# Patient Record
Sex: Male | Born: 1955 | Race: White | Hispanic: No | Marital: Married | Smoking: Former smoker
Health system: Southern US, Community
[De-identification: ages and names within clinical notes are randomized; demographics above are authoritative.]

## PROBLEM LIST (undated history)

## (undated) DIAGNOSIS — E785 Hyperlipidemia, unspecified: Secondary | ICD-10-CM

## (undated) DIAGNOSIS — F32A Depression, unspecified: Secondary | ICD-10-CM

## (undated) DIAGNOSIS — N411 Chronic prostatitis: Secondary | ICD-10-CM

## (undated) DIAGNOSIS — Z720 Tobacco use: Secondary | ICD-10-CM

## (undated) DIAGNOSIS — F419 Anxiety disorder, unspecified: Secondary | ICD-10-CM

## (undated) DIAGNOSIS — I2510 Atherosclerotic heart disease of native coronary artery without angina pectoris: Secondary | ICD-10-CM

## (undated) DIAGNOSIS — G4730 Sleep apnea, unspecified: Secondary | ICD-10-CM

## (undated) DIAGNOSIS — I739 Peripheral vascular disease, unspecified: Secondary | ICD-10-CM

## (undated) DIAGNOSIS — I10 Essential (primary) hypertension: Secondary | ICD-10-CM

## (undated) DIAGNOSIS — K219 Gastro-esophageal reflux disease without esophagitis: Secondary | ICD-10-CM

## (undated) DIAGNOSIS — J449 Chronic obstructive pulmonary disease, unspecified: Secondary | ICD-10-CM

## (undated) DIAGNOSIS — G4733 Obstructive sleep apnea (adult) (pediatric): Secondary | ICD-10-CM

## (undated) DIAGNOSIS — E669 Obesity, unspecified: Secondary | ICD-10-CM

## (undated) DIAGNOSIS — F329 Major depressive disorder, single episode, unspecified: Secondary | ICD-10-CM

## (undated) DIAGNOSIS — Z86718 Personal history of other venous thrombosis and embolism: Secondary | ICD-10-CM

## (undated) HISTORY — PX: CYSTOSCOPY: SUR368

## (undated) HISTORY — DX: Obstructive sleep apnea (adult) (pediatric): G47.33

## (undated) HISTORY — DX: Peripheral vascular disease, unspecified: I73.9

## (undated) HISTORY — DX: Atherosclerotic heart disease of native coronary artery without angina pectoris: I25.10

## (undated) HISTORY — DX: Sleep apnea, unspecified: G47.30

## (undated) HISTORY — DX: Depression, unspecified: F32.A

## (undated) HISTORY — PX: CHOLECYSTECTOMY: SHX55

## (undated) HISTORY — DX: Essential (primary) hypertension: I10

## (undated) HISTORY — DX: Chronic prostatitis: N41.1

## (undated) HISTORY — DX: Personal history of other venous thrombosis and embolism: Z86.718

## (undated) HISTORY — DX: Major depressive disorder, single episode, unspecified: F32.9

## (undated) HISTORY — DX: Gastro-esophageal reflux disease without esophagitis: K21.9

## (undated) HISTORY — PX: KNEE ARTHROSCOPY: SUR90

## (undated) HISTORY — DX: Anxiety disorder, unspecified: F41.9

## (undated) HISTORY — DX: Chronic obstructive pulmonary disease, unspecified: J44.9

## (undated) HISTORY — DX: Tobacco use: Z72.0

## (undated) HISTORY — DX: Hyperlipidemia, unspecified: E78.5

---

## 1983-09-26 ENCOUNTER — Encounter: Payer: Self-pay | Admitting: Family Medicine

## 1983-09-26 LAB — CONVERTED CEMR LAB
Blood Glucose, Fasting: 91 mg/dL
RBC count: 5.36 10*6/uL

## 1987-11-24 ENCOUNTER — Encounter: Payer: Self-pay | Admitting: Family Medicine

## 1987-11-24 LAB — CONVERTED CEMR LAB: WBC, blood: 7.1 10*3/uL

## 1989-02-21 ENCOUNTER — Encounter: Payer: Self-pay | Admitting: Family Medicine

## 1989-02-21 LAB — CONVERTED CEMR LAB: RBC count: 5.5 10*6/uL

## 1991-05-03 ENCOUNTER — Encounter: Payer: Self-pay | Admitting: Family Medicine

## 1991-05-03 LAB — CONVERTED CEMR LAB
Blood Glucose, Fasting: 97 mg/dL
RBC count: 5.13 10*6/uL
WBC, blood: 9.8 10*3/uL

## 1992-08-03 ENCOUNTER — Encounter: Payer: Self-pay | Admitting: Family Medicine

## 1992-08-03 LAB — CONVERTED CEMR LAB
RBC count: 5.61 10*6/uL
WBC, blood: 8.8 10*3/uL

## 1993-08-30 ENCOUNTER — Encounter: Payer: Self-pay | Admitting: Family Medicine

## 1993-08-30 LAB — CONVERTED CEMR LAB: PSA: 0.8 ng/mL

## 1993-08-31 ENCOUNTER — Encounter: Payer: Self-pay | Admitting: Family Medicine

## 1994-03-26 ENCOUNTER — Encounter: Payer: Self-pay | Admitting: Family Medicine

## 1994-03-26 LAB — CONVERTED CEMR LAB
Blood Glucose, Fasting: 80 mg/dL
RBC count: 5.37 10*6/uL

## 1994-07-30 ENCOUNTER — Encounter: Payer: Self-pay | Admitting: Family Medicine

## 1994-07-30 LAB — CONVERTED CEMR LAB
Blood Glucose, Fasting: 96 mg/dL
WBC, blood: 8 10*3/uL

## 1995-09-07 ENCOUNTER — Encounter: Payer: Self-pay | Admitting: Family Medicine

## 1995-09-07 LAB — CONVERTED CEMR LAB: PSA: 0.4 ng/mL

## 1995-09-23 ENCOUNTER — Encounter: Payer: Self-pay | Admitting: Family Medicine

## 1996-01-21 ENCOUNTER — Encounter: Payer: Self-pay | Admitting: Family Medicine

## 1996-01-21 LAB — CONVERTED CEMR LAB: PSA: 0.3 ng/mL

## 1996-01-22 ENCOUNTER — Encounter: Payer: Self-pay | Admitting: Family Medicine

## 1996-01-22 LAB — CONVERTED CEMR LAB
Blood Glucose, Fasting: 101 mg/dL
RBC count: 5.26 10*6/uL

## 1996-04-15 ENCOUNTER — Encounter: Payer: Self-pay | Admitting: Family Medicine

## 1996-04-15 LAB — CONVERTED CEMR LAB: RBC count: 5.2 10*6/uL

## 1996-10-17 ENCOUNTER — Encounter: Payer: Self-pay | Admitting: Family Medicine

## 1996-10-17 LAB — CONVERTED CEMR LAB: RBC count: 5.33 10*6/uL

## 1996-10-19 ENCOUNTER — Encounter: Payer: Self-pay | Admitting: Family Medicine

## 1996-10-19 LAB — CONVERTED CEMR LAB
Blood Glucose, Fasting: 89 mg/dL
PSA: 0.4 ng/mL

## 1997-05-17 ENCOUNTER — Encounter: Payer: Self-pay | Admitting: Family Medicine

## 1997-05-17 LAB — CONVERTED CEMR LAB: Blood Glucose, Fasting: 85 mg/dL

## 1997-09-26 ENCOUNTER — Encounter: Payer: Self-pay | Admitting: Family Medicine

## 1997-09-26 LAB — CONVERTED CEMR LAB: RBC count: 5.11 10*6/uL

## 1997-09-27 ENCOUNTER — Encounter: Payer: Self-pay | Admitting: Family Medicine

## 1997-09-27 LAB — CONVERTED CEMR LAB: Blood Glucose, Fasting: 83 mg/dL

## 1997-12-27 ENCOUNTER — Encounter: Payer: Self-pay | Admitting: Family Medicine

## 1997-12-27 LAB — CONVERTED CEMR LAB: WBC, blood: 8.4 10*3/uL

## 1999-06-18 ENCOUNTER — Encounter: Payer: Self-pay | Admitting: Family Medicine

## 2001-01-04 ENCOUNTER — Encounter: Payer: Self-pay | Admitting: Urology

## 2001-01-07 ENCOUNTER — Ambulatory Visit (HOSPITAL_COMMUNITY): Admission: RE | Admit: 2001-01-07 | Discharge: 2001-01-07 | Payer: Self-pay | Admitting: Urology

## 2001-01-07 ENCOUNTER — Encounter (INDEPENDENT_AMBULATORY_CARE_PROVIDER_SITE_OTHER): Payer: Self-pay | Admitting: Specialist

## 2003-09-25 ENCOUNTER — Emergency Department (HOSPITAL_COMMUNITY): Admission: EM | Admit: 2003-09-25 | Discharge: 2003-09-25 | Payer: Self-pay | Admitting: Emergency Medicine

## 2004-06-26 ENCOUNTER — Ambulatory Visit: Payer: Self-pay | Admitting: Family Medicine

## 2004-12-16 ENCOUNTER — Ambulatory Visit: Payer: Self-pay | Admitting: Family Medicine

## 2004-12-16 LAB — CONVERTED CEMR LAB
Blood Glucose, Fasting: 105 mg/dL
PSA: 0.6 ng/mL
TSH: 1.4 microintl units/mL

## 2004-12-18 ENCOUNTER — Ambulatory Visit: Payer: Self-pay | Admitting: Family Medicine

## 2005-01-30 ENCOUNTER — Ambulatory Visit: Payer: Self-pay | Admitting: Family Medicine

## 2005-07-25 ENCOUNTER — Ambulatory Visit: Payer: Self-pay | Admitting: Family Medicine

## 2005-12-03 ENCOUNTER — Ambulatory Visit: Payer: Self-pay | Admitting: Family Medicine

## 2006-02-04 ENCOUNTER — Ambulatory Visit: Payer: Self-pay | Admitting: Family Medicine

## 2006-02-04 LAB — CONVERTED CEMR LAB
Blood Glucose, Fasting: 100 mg/dL
Hgb A1c MFr Bld: 5.2 %
PSA: 0.48 ng/mL
RBC count: 5.24 10*6/uL
TSH: 1.18 microintl units/mL
WBC, blood: 7.2 10*3/uL

## 2006-03-13 ENCOUNTER — Ambulatory Visit: Payer: Self-pay | Admitting: Family Medicine

## 2006-05-26 ENCOUNTER — Ambulatory Visit: Payer: Self-pay | Admitting: Family Medicine

## 2006-05-26 LAB — CONVERTED CEMR LAB: Rapid Strep: NEGATIVE

## 2006-06-10 ENCOUNTER — Ambulatory Visit: Payer: Self-pay | Admitting: Family Medicine

## 2006-08-31 ENCOUNTER — Ambulatory Visit: Payer: Self-pay | Admitting: Family Medicine

## 2006-12-03 ENCOUNTER — Ambulatory Visit: Payer: Self-pay | Admitting: Family Medicine

## 2006-12-03 DIAGNOSIS — F419 Anxiety disorder, unspecified: Secondary | ICD-10-CM | POA: Insufficient documentation

## 2007-01-12 ENCOUNTER — Ambulatory Visit: Payer: Self-pay | Admitting: Family Medicine

## 2007-01-12 DIAGNOSIS — Z87891 Personal history of nicotine dependence: Secondary | ICD-10-CM | POA: Insufficient documentation

## 2007-01-12 DIAGNOSIS — R0602 Shortness of breath: Secondary | ICD-10-CM | POA: Insufficient documentation

## 2007-01-22 ENCOUNTER — Ambulatory Visit: Payer: Self-pay | Admitting: Internal Medicine

## 2007-01-28 ENCOUNTER — Ambulatory Visit: Payer: Self-pay | Admitting: Pulmonary Disease

## 2007-02-03 ENCOUNTER — Encounter: Payer: Self-pay | Admitting: Family Medicine

## 2007-02-03 ENCOUNTER — Ambulatory Visit: Payer: Self-pay

## 2007-02-03 ENCOUNTER — Encounter: Payer: Self-pay | Admitting: Internal Medicine

## 2007-03-05 ENCOUNTER — Ambulatory Visit: Payer: Self-pay | Admitting: Pulmonary Disease

## 2007-03-05 ENCOUNTER — Ambulatory Visit (HOSPITAL_BASED_OUTPATIENT_CLINIC_OR_DEPARTMENT_OTHER): Admission: RE | Admit: 2007-03-05 | Discharge: 2007-03-05 | Payer: Self-pay | Admitting: Pulmonary Disease

## 2007-04-28 ENCOUNTER — Ambulatory Visit: Payer: Self-pay | Admitting: Pulmonary Disease

## 2007-04-28 DIAGNOSIS — J449 Chronic obstructive pulmonary disease, unspecified: Secondary | ICD-10-CM | POA: Insufficient documentation

## 2007-04-28 DIAGNOSIS — G4733 Obstructive sleep apnea (adult) (pediatric): Secondary | ICD-10-CM | POA: Insufficient documentation

## 2007-04-28 DIAGNOSIS — J4489 Other specified chronic obstructive pulmonary disease: Secondary | ICD-10-CM | POA: Insufficient documentation

## 2007-07-22 ENCOUNTER — Telehealth (INDEPENDENT_AMBULATORY_CARE_PROVIDER_SITE_OTHER): Payer: Self-pay | Admitting: Internal Medicine

## 2007-07-29 ENCOUNTER — Ambulatory Visit: Payer: Self-pay | Admitting: Family Medicine

## 2007-07-29 DIAGNOSIS — R079 Chest pain, unspecified: Secondary | ICD-10-CM | POA: Insufficient documentation

## 2007-07-29 DIAGNOSIS — R252 Cramp and spasm: Secondary | ICD-10-CM | POA: Insufficient documentation

## 2007-12-06 ENCOUNTER — Ambulatory Visit: Payer: Self-pay | Admitting: Family Medicine

## 2007-12-06 LAB — CONVERTED CEMR LAB
ALT: 24 units/L (ref 0–53)
AST: 18 units/L (ref 0–37)
Albumin: 3.7 g/dL (ref 3.5–5.2)
Alkaline Phosphatase: 65 units/L (ref 39–117)
BUN: 12 mg/dL (ref 6–23)
Basophils Absolute: 0.1 10*3/uL (ref 0.0–0.1)
Basophils Relative: 0.8 % (ref 0.0–3.0)
Bilirubin, Direct: 0.1 mg/dL (ref 0.0–0.3)
CO2: 29 meq/L (ref 19–32)
Calcium: 9.2 mg/dL (ref 8.4–10.5)
Chloride: 108 meq/L (ref 96–112)
Cholesterol: 166 mg/dL (ref 0–200)
Creatinine, Ser: 1.2 mg/dL (ref 0.4–1.5)
Direct LDL: 94.1 mg/dL
Eosinophils Absolute: 0.3 10*3/uL (ref 0.0–0.7)
Eosinophils Relative: 3.4 % (ref 0.0–5.0)
GFR calc Af Amer: 83 mL/min
GFR calc non Af Amer: 68 mL/min
Glucose, Bld: 108 mg/dL — ABNORMAL HIGH (ref 70–99)
HCT: 46.8 % (ref 39.0–52.0)
HDL: 23.6 mg/dL — ABNORMAL LOW (ref >39.0)
Hemoglobin: 16.3 g/dL (ref 13.0–17.0)
Iron: 75 ug/dL (ref 42–165)
Lymphocytes Relative: 29.1 % (ref 12.0–46.0)
MCHC: 34.9 g/dL (ref 30.0–36.0)
MCV: 89.9 fL (ref 78.0–100.0)
Monocytes Absolute: 0.5 10*3/uL (ref 0.1–1.0)
Monocytes Relative: 6.2 % (ref 3.0–12.0)
Neutro Abs: 5.3 10*3/uL (ref 1.4–7.7)
Neutrophils Relative %: 60.5 % (ref 43.0–77.0)
PSA: 0.74 ng/mL (ref 0.10–4.00)
Platelets: 224 10*3/uL (ref 150–400)
Potassium: 5.1 meq/L (ref 3.5–5.1)
RBC: 5.21 M/uL (ref 4.22–5.81)
RDW: 12.5 % (ref 11.5–14.6)
Sodium: 143 meq/L (ref 135–145)
TSH: 1.55 microintl units/mL (ref 0.35–5.50)
Total Bilirubin: 0.6 mg/dL (ref 0.3–1.2)
Total CHOL/HDL Ratio: 7
Total Protein: 6.8 g/dL (ref 6.0–8.3)
Triglycerides: 266 mg/dL (ref 0–149)
VLDL: 53 mg/dL — ABNORMAL HIGH (ref 0–40)
WBC: 8.7 10*3/uL (ref 4.5–10.5)

## 2007-12-09 ENCOUNTER — Ambulatory Visit: Payer: Self-pay | Admitting: Family Medicine

## 2007-12-09 DIAGNOSIS — R7309 Other abnormal glucose: Secondary | ICD-10-CM | POA: Insufficient documentation

## 2007-12-09 DIAGNOSIS — K219 Gastro-esophageal reflux disease without esophagitis: Secondary | ICD-10-CM | POA: Insufficient documentation

## 2007-12-13 ENCOUNTER — Encounter: Payer: Self-pay | Admitting: Family Medicine

## 2007-12-13 DIAGNOSIS — E785 Hyperlipidemia, unspecified: Secondary | ICD-10-CM | POA: Insufficient documentation

## 2007-12-13 DIAGNOSIS — I10 Essential (primary) hypertension: Secondary | ICD-10-CM | POA: Insufficient documentation

## 2007-12-13 DIAGNOSIS — N3010 Interstitial cystitis (chronic) without hematuria: Secondary | ICD-10-CM | POA: Insufficient documentation

## 2007-12-13 DIAGNOSIS — Z9889 Other specified postprocedural states: Secondary | ICD-10-CM | POA: Insufficient documentation

## 2007-12-26 DIAGNOSIS — F3289 Other specified depressive episodes: Secondary | ICD-10-CM | POA: Insufficient documentation

## 2007-12-26 DIAGNOSIS — F329 Major depressive disorder, single episode, unspecified: Secondary | ICD-10-CM | POA: Insufficient documentation

## 2007-12-26 DIAGNOSIS — N401 Enlarged prostate with lower urinary tract symptoms: Secondary | ICD-10-CM | POA: Insufficient documentation

## 2007-12-26 DIAGNOSIS — N419 Inflammatory disease of prostate, unspecified: Secondary | ICD-10-CM | POA: Insufficient documentation

## 2008-04-12 ENCOUNTER — Ambulatory Visit: Payer: Self-pay | Admitting: Family Medicine

## 2008-04-12 DIAGNOSIS — G8929 Other chronic pain: Secondary | ICD-10-CM | POA: Insufficient documentation

## 2008-04-12 DIAGNOSIS — M545 Low back pain: Secondary | ICD-10-CM | POA: Insufficient documentation

## 2008-06-22 ENCOUNTER — Ambulatory Visit: Payer: Self-pay | Admitting: Family Medicine

## 2008-06-23 ENCOUNTER — Ambulatory Visit: Payer: Self-pay | Admitting: Internal Medicine

## 2008-06-30 ENCOUNTER — Ambulatory Visit: Payer: Self-pay | Admitting: Internal Medicine

## 2008-06-30 ENCOUNTER — Inpatient Hospital Stay (HOSPITAL_BASED_OUTPATIENT_CLINIC_OR_DEPARTMENT_OTHER): Admission: RE | Admit: 2008-06-30 | Discharge: 2008-06-30 | Payer: Self-pay | Admitting: Internal Medicine

## 2008-07-13 ENCOUNTER — Ambulatory Visit: Payer: Self-pay | Admitting: Family Medicine

## 2008-07-18 ENCOUNTER — Ambulatory Visit: Payer: Self-pay

## 2008-07-25 ENCOUNTER — Encounter (INDEPENDENT_AMBULATORY_CARE_PROVIDER_SITE_OTHER): Payer: Self-pay | Admitting: *Deleted

## 2008-07-25 DIAGNOSIS — I2510 Atherosclerotic heart disease of native coronary artery without angina pectoris: Secondary | ICD-10-CM | POA: Insufficient documentation

## 2008-07-25 DIAGNOSIS — I10 Essential (primary) hypertension: Secondary | ICD-10-CM | POA: Insufficient documentation

## 2008-07-26 ENCOUNTER — Encounter: Payer: Self-pay | Admitting: Internal Medicine

## 2008-07-26 ENCOUNTER — Ambulatory Visit: Payer: Self-pay | Admitting: Internal Medicine

## 2008-07-26 DIAGNOSIS — I7389 Other specified peripheral vascular diseases: Secondary | ICD-10-CM | POA: Insufficient documentation

## 2008-07-26 DIAGNOSIS — R0989 Other specified symptoms and signs involving the circulatory and respiratory systems: Secondary | ICD-10-CM | POA: Insufficient documentation

## 2008-11-01 ENCOUNTER — Telehealth: Payer: Self-pay | Admitting: Family Medicine

## 2008-11-02 ENCOUNTER — Ambulatory Visit: Payer: Self-pay | Admitting: Family Medicine

## 2008-12-12 ENCOUNTER — Ambulatory Visit: Payer: Self-pay | Admitting: Family Medicine

## 2008-12-12 LAB — CONVERTED CEMR LAB
ALT: 22 units/L (ref 0–53)
BUN: 12 mg/dL (ref 6–23)
Basophils Relative: 0.4 % (ref 0.0–3.0)
CO2: 29 meq/L (ref 19–32)
Chloride: 109 meq/L (ref 96–112)
Cholesterol: 187 mg/dL (ref 0–200)
Creatinine, Ser: 1 mg/dL (ref 0.4–1.5)
Creatinine,U: 152.9 mg/dL
Eosinophils Absolute: 0.2 10*3/uL (ref 0.0–0.7)
Eosinophils Relative: 2.7 % (ref 0.0–5.0)
HCT: 47.1 % (ref 39.0–52.0)
Lymphs Abs: 2.3 10*3/uL (ref 0.7–4.0)
MCHC: 34.7 g/dL (ref 30.0–36.0)
MCV: 90.4 fL (ref 78.0–100.0)
Microalb, Ur: 3.1 mg/dL — ABNORMAL HIGH (ref 0.0–1.9)
Monocytes Absolute: 0.6 10*3/uL (ref 0.1–1.0)
Neutrophils Relative %: 59.3 % (ref 43.0–77.0)
Platelets: 185 10*3/uL (ref 150.0–400.0)
Potassium: 5.3 meq/L — ABNORMAL HIGH (ref 3.5–5.1)
TSH: 1.58 microintl units/mL (ref 0.35–5.50)
Total Bilirubin: 0.8 mg/dL (ref 0.3–1.2)
Total Protein: 7 g/dL (ref 6.0–8.3)
VLDL: 41.4 mg/dL — ABNORMAL HIGH (ref 0.0–40.0)
WBC: 7.7 10*3/uL (ref 4.5–10.5)

## 2008-12-13 ENCOUNTER — Encounter: Payer: Self-pay | Admitting: Family Medicine

## 2008-12-18 ENCOUNTER — Ambulatory Visit: Payer: Self-pay | Admitting: Family Medicine

## 2008-12-19 ENCOUNTER — Encounter: Payer: Self-pay | Admitting: Cardiovascular Disease

## 2008-12-19 ENCOUNTER — Ambulatory Visit: Payer: Self-pay

## 2009-01-19 ENCOUNTER — Encounter: Payer: Self-pay | Admitting: Family Medicine

## 2009-03-07 ENCOUNTER — Encounter: Payer: Self-pay | Admitting: Family Medicine

## 2009-07-24 ENCOUNTER — Telehealth: Payer: Self-pay | Admitting: Family Medicine

## 2009-08-22 ENCOUNTER — Ambulatory Visit: Payer: Self-pay | Admitting: Family Medicine

## 2009-09-18 ENCOUNTER — Ambulatory Visit: Payer: Self-pay | Admitting: Family Medicine

## 2009-09-18 DIAGNOSIS — J069 Acute upper respiratory infection, unspecified: Secondary | ICD-10-CM | POA: Insufficient documentation

## 2009-09-25 ENCOUNTER — Encounter: Payer: Self-pay | Admitting: Family Medicine

## 2009-12-18 ENCOUNTER — Encounter (INDEPENDENT_AMBULATORY_CARE_PROVIDER_SITE_OTHER): Payer: Self-pay | Admitting: *Deleted

## 2009-12-26 ENCOUNTER — Encounter: Payer: Self-pay | Admitting: Family Medicine

## 2010-02-05 ENCOUNTER — Encounter: Payer: Self-pay | Admitting: Family Medicine

## 2010-02-20 ENCOUNTER — Encounter: Payer: Self-pay | Admitting: Family Medicine

## 2010-02-27 ENCOUNTER — Ambulatory Visit (HOSPITAL_COMMUNITY): Admission: RE | Admit: 2010-02-27 | Discharge: 2010-02-27 | Payer: Self-pay | Admitting: Urology

## 2010-03-19 ENCOUNTER — Encounter: Payer: Self-pay | Admitting: Family Medicine

## 2010-03-28 ENCOUNTER — Telehealth: Payer: Self-pay | Admitting: Family Medicine

## 2010-06-09 LAB — CONVERTED CEMR LAB
BUN: 11 mg/dL (ref 6–23)
CO2: 24 meq/L (ref 19–32)
Calcium: 9.3 mg/dL (ref 8.4–10.5)
Creatinine, Ser: 0.98 mg/dL (ref 0.40–1.50)
Glucose, Bld: 93 mg/dL (ref 70–99)
HCT: 49.8 % (ref 39.0–52.0)
Hemoglobin: 16.9 g/dL (ref 13.0–17.0)
MCHC: 33.9 g/dL (ref 30.0–36.0)
MCV: 87.7 fL (ref 78.0–100.0)
Platelets: 254 10*3/uL (ref 150–400)
RDW: 12.7 % (ref 11.5–15.5)

## 2010-06-11 NOTE — Letter (Signed)
Summary: [facility]  [facility]   Imported By: Lanelle Bal 02/28/2010 12:54:47  _____________________________________________________________________  External Attachment:    Type:   Image     Comment:   External Document

## 2010-06-11 NOTE — Medication Information (Signed)
Summary: Order for CPAP Supplies/[facility]  Order for CPAP Supplies/[facility]   Imported By: Maryln Gottron 12/28/2009 15:45:01  _____________________________________________________________________  External Attachment:    Type:   Image     Comment:   External Document

## 2010-06-11 NOTE — Letter (Signed)
Summary: Dr.Marc Magod,GI,Note  Dr.Marc Magod,GI,Note   Imported By: Beau Fanny 09/25/2009 16:40:14  _____________________________________________________________________  External Attachment:    Type:   Image     Comment:   External Document

## 2010-06-11 NOTE — Assessment & Plan Note (Signed)
Summary: MED REFILL/DLO   Vital Signs:  Patient profile:   55 year old male Weight:      271.25 pounds BMI:     37.97 Temp:     98.6 degrees F oral Pulse rate:   64 / minute Pulse rhythm:   regular BP sitting:   138 / 78  (left arm) Cuff size:   large  Vitals Entered By: Sydell Axon LPN (August 22, 2009 12:25 PM) CC: Discuss changing directions on medications and needs refills   History of Present Illness: Pt here for medication refill and needs refills. He wants to switch back to monthly prescriptions. He has had trouble with three month and cutting pills and doing things that do not benefit him and make life harder He thinks his wife is ready to quit smoking which he thinks will make it possible for him to quit as well. He knows he must then tackle weight and is willing to do so.  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) 9)  Depression, Recurrent  (ICD-311) 10)  Hyperlipidemia  (ICD-272.4) 11)  Hypertension, Essential  (ICD-401.9) 12)  Carpal Tunnel Release, Bilateral, Hx of  (ICD-V45.89) 13)  Chronic Interstitial Cystitis  (ICD-595.1) 14)  Hyperglycemia  (ICD-790.29) 15)  Gerd  (ICD-530.81) 16)  Special Screening Malignant Neoplasm of Prostate  (ICD-V76.44) 17)  Health Maintenance Exam  (ICD-V70.0) 18)  Muscle Cramps  (ICD-729.82) 19)  Obesity  (ICD-278.00) 20)  Chest Pain Unspecified  (ICD-786.50) 21)  Copd, Mild  (ICD-496) 22)  Obstructive Sleep Apnea  (ICD-327.23) 23)  Hx, Personal, Tobacco Use  (ICD-V15.82) 24)  Sob  (ICD-786.05) 25)  Anxiety  (ICD-300.00)  Medications Prior to Update: 1)  Terazosin Hcl 10 Mg Caps (Terazosin Hcl) ....  Take 1 Tablet By Mouth Once A Day 2)  Lisinopril 5 Mg Tabs (Lisinopril) .... Take 1 Tablet By Mouth Once A Day 3)  Alprazolam 1 Mg Tabs (Alprazolam) .... Take 1/2 Tablet By Mouth Once Daily 4)  Adult Aspirin Ec Low Strength 81 Mg  Tbec (Aspirin) .... Take 1 Tablet By Mouth Once A Day 5)  Sertraline Hcl 100 Mg  Tabs (Sertraline Hcl) .... 1/2 Tablet Daily By Mouth  Allergies: 1)  * Relafen (Nabumetone)  Physical Exam  General:  Well-developed,well-nourished,in no acute distress; alert,appropriate and cooperative throughout examination, comfortable, breathing easily but obese. Head:  Normocephalic and atraumatic without obvious abnormalities. No apparent alopecia or balding. Eyes:  Conjunctiva clear bilaterally.  Ears:  External ear exam shows no significant lesions or deformities.  Otoscopic examination reveals tympanic membranes are intact bilaterally without bulging, retraction, inflammation or discharge. Hearing is grossly normal bilaterally. Cerumen impacted bilat. Nose:  No deformity or lesions noted.   Mouth:  Lips, gums, and teeth appear normal. Tongue is intact without lesions.  Neck:  No deformities, masses, or tenderness noted. Chest Wall:  No deformities, masses, tenderness or gynecomastia noted. Lungs:  Normal respiratory effort, chest expands symmetrically. Lungs are clear to auscultation, no crackles or wheezes. Heart:  Normal rate and regular rhythm. S1 and S2 normal without gallop, murmur, click, rub or other extra sounds.   Impression & Recommendations:  Problem # 1:  HYPERTENSION, BENIGN (ICD-401.1) Assessment Unchanged Stable and weight loss should bring it down. Cont curr meds. His updated medication list for this problem includes:    Terazosin Hcl 10 Mg Caps (Terazosin hcl) .Marland Kitchen... Take 1 tablet by mouth once a day    Lisinopril 5 Mg Tabs (Lisinopril) .Marland Kitchen... Take 1 tablet by mouth once a day  BP today: 138/78 Prior BP: 130/74 (12/18/2008)  Labs Reviewed: K+: 5.3  (12/12/2008) Creat: : 1.0 (12/12/2008)   Chol: 187 (12/12/2008)   HDL: 31.20 (12/12/2008)   LDL: DEL (12/06/2007)   TG: 207.0 (12/12/2008)  Problem # 2:  ANXIETY (ICD-300.00) Assessment: Unchanged  Stable, incr sertraline (from 1/2 to whole tab) and see if can limit Xanax. His updated medication list for this problem includes:    Alprazolam 1 Mg Tabs (Alprazolam) .Marland Kitchen... Take 1/2 tablet by mouth twice daily as needed for anxiety.    Sertraline Hcl 100 Mg Tabs (Sertraline hcl) .Marland Kitchen... 1 tablet daily by mouth  Discussed medication use and relaxation techniques.   Problem # 3:  DEPRESSION, RECURRENT (ICD-311) Assessment: Unchanged Well controlled. Will need to cont Sertraline due to recurrence in my opinion. His updated medication list for this problem includes:    Alprazolam 1 Mg Tabs (Alprazolam) .Marland Kitchen... Take 1/2 tablet by mouth twice daily as needed for anxiety.    Sertraline Hcl 100 Mg Tabs (Sertraline hcl) .Marland Kitchen... 1 tablet daily by mouth  Complete Medication List: 1)  Terazosin Hcl 10 Mg Caps (Terazosin hcl) .... Take 1 tablet by mouth once a day 2)  Lisinopril 5 Mg Tabs (Lisinopril) .... Take 1 tablet by mouth once a day 3)  Alprazolam 1 Mg Tabs (Alprazolam) .... Take 1/2 tablet by mouth twice daily as needed for anxiety. 4)  Adult Aspirin Ec Low Strength 81 Mg Tbec (Aspirin) .... Take 1 tablet by mouth once a day 5)  Sertraline Hcl 100 Mg Tabs (Sertraline hcl) .Marland Kitchen.. 1 tablet daily by mouth 6)  Nexium 40 Mg Cpdr (Esomeprazole magnesium) .... Take one by mouth daily  Patient Instructions: 1)  RTC as discussed. Prescriptions: SERTRALINE HCL 100 MG  TABS (SERTRALINE HCL) 1 tablet daily by mouth  #30 x 12   Entered and Authorized by:   Shaune Leeks MD   Signed by:   Shaune Leeks MD on 08/22/2009   Method used:   [facility] to        [facility] [phone]* (retail)       [address]       [place], [place]  [address]       Ph: [phone]       Fax:  [phone]   RxID:   [id number] ALPRAZOLAM 1 MG TABS (ALPRAZOLAM) Take 1/2 tablet by mouth twice daily as needed for anxiety.  #30 x 12   Entered and Authorized by:   Shaune Leeks MD   Signed by:   Shaune Leeks MD on 08/22/2009   Method used:   Print then Give to Patient   RxID:   [id number] LISINOPRIL 5 MG TABS (LISINOPRIL) Take 1 tablet by mouth once a day  #30 x 12   Entered and Authorized by:   Shaune Leeks MD   Signed by:   Shaune Leeks MD on 08/22/2009   Method used:   [facility]        [facility]  [address] [phone]* (retail)  [address]       [place], [place]  [address]       Ph: [phone]       Fax: [phone]   RxID:   [id number] TERAZOSIN HCL 10 MG CAPS (TERAZOSIN HCL) Take 1 tablet by mouth once a day  #30 x 12   Entered and Authorized by:   Shaune Leeks MD   Signed by:   Shaune Leeks MD on 08/22/2009   Method used:   Electronically to        [facility] [phone]* (retail)       [address]       [place], [place]  [address]       Ph: [phone]       Fax: [phone]   RxID:   [id number]   Current Allergies (reviewed today): * RELAFEN (NABUMETONE)

## 2010-06-11 NOTE — Letter (Signed)
Summary: Nadara Eaton letter  [place] at  [facility]  [address] [place], [place] [address]   Phone: [phone]  Fax: [phone]       12/18/2009 MRN: [id number]  Shmuel Matsen [address] [place], [place]  [address]  Dear Mr. Luca,  [facility] announce the retirement of Arta Silence, M.D., from full-time practice at the [facility] office effective November 08, 2009 and his plans of returning part-time.  It is important to Dr. Hetty Ely and to our practice that you understand that [facility] has seven physicians in our office for your health care needs.  We will continue to offer the same exceptional care that you have today.    Dr. Hetty Ely has spoken to many of you about his plans for retirement and returning part-time in the fall.   We will continue to work with you through the transition to schedule appointments for you in the office and meet the high standards that [facility] is committed to.   Again, it is with great pleasure that we share the news that Dr. Hetty Ely will return to [facility] in October of 2011 with a reduced schedule.    If you have any questions, or would like to request an appointment with one of our physicians, please call us at [phone] and press the option for Scheduling an appointment.  We take pleasure in providing you with excellent patient care and look forward to seeing you at your next office visit.  Our [facility] Physicians are:  Tillman Abide, M.D. Laurita Quint, M.D. Roxy Manns, M.D. Kerby Nora, M.D. Hannah Beat, M.D. Ruthe Mannan, M.D. We proudly welcomed Raechel Ache, M.D. and Eustaquio Boyden, M.D. to the practice in July/August 2011.  Sincerely,  [facility]

## 2010-06-11 NOTE — Assessment & Plan Note (Signed)
Summary: TICK BITE, FLU LIKE SYMTH AND BACK PAIN   Vital Signs:  Patient profile:   54 year old male Weight:      268.25 pounds Temp:     98.6 degrees F oral Pulse rate:   60 / minute Pulse rhythm:   regular BP sitting:   120 / 70  (left arm) Cuff size:   large  Vitals Entered By: Sydell Axon LPN (Sep 18, 2009 11:31 AM) CC: Has had 3 tick bites in the last 3 weeks, having flu like symptoms, backache, head congestion and ear pain   History of Present Illness: Pt here or tick bites x3 each he thinks were on him less than 24hrs. The first was under his arm and could have been longer. He was in [place] last week working and felt bad last week. Last night he all of a sudden felt like he was injected with the flu. His ears are better than last time. He also hurt his back over the weekend. He took a vicodin this AM. He moved 14 yrds of bark over the weekend and that caused his pain.  He has headache in the frontal area but also all over. He is alsmost woozy from pressure esp with cough, some bad ear pain last night, not as bad today. He had fever and chills yesterday and worse last night, not as bad today, some ST last night, better today, Throat also feels swollen, rhinitis that is clear, cough that is better today also, not able to get anything up but hurt his back.  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82) 19)  Obesity  (ICD-278.00) 20)  Chest Pain Unspecified  (ICD-786.50) 21)  Copd, Mild  (ICD-496) 22)  Obstructive Sleep Apnea  (ICD-327.23) 23)  Hx, Personal, Tobacco Use  (ICD-V15.82) 24)  Sob  (ICD-786.05) 25)  Anxiety  (ICD-300.00)  Medications Prior to Update: 1)  Terazosin Hcl 10 Mg Caps (Terazosin Hcl) .... Take 1 Tablet By Mouth Once A Day 2)  Lisinopril 5 Mg Tabs (Lisinopril) .... Take 1 Tablet By Mouth Once A Day 3)  Alprazolam 1 Mg Tabs (Alprazolam) .... Take 1/2 Tablet By Mouth Twice Daily As Needed For Anxiety. 4)  Adult Aspirin Ec Low Strength 81 Mg  Tbec (Aspirin) .... Take 1 Tablet By Mouth Once A Day 5)  Sertraline Hcl 100 Mg  Tabs (Sertraline Hcl) .Marland Kitchen.. 1 Tablet Daily By Mouth 6)  Nexium 40 Mg Cpdr (Esomeprazole Magnesium) .... Take One By Mouth Daily  Allergies: 1)  * Relafen (Nabumetone)  Physical Exam  General:  Well-developed,well-nourished,in no acute distress; alert,appropriate and cooperative throughout examination, comfortable, breathing easily but obese. Head:  Normocephalic and atraumatic without obvious abnormalities. No apparent alopecia or balding. Eyes:  Conjunctiva clear bilaterally.  Ears:  External ear exam shows no significant lesions or deformities.  Otoscopic examination reveals tympanic membranes are intact bilaterally without bulging, retraction, inflammation or discharge. Hearing is grossly normal bilaterally. Cerumen impacted bilat. Nose:  No deformity or lesions noted.   Mouth:  Lips, gums, and teeth appear normal. Tongue is intact without lesions.  Neck:  No deformities, masses, or tenderness noted. Chest Wall:  No deformities, masses, tenderness or gynecomastia noted. Lungs:  Normal respiratory effort, chest expands symmetrically. Lungs are clear to auscultation, no crackles or  wheezes. Heart:  Normal rate and regular rhythm. S1 and S2 normal without gallop, murmur, click, rub or other extra sounds. Abdomen:  Bowel sounds positive,abdomen soft and non-tender without masses, organomegaly or hernias noted. Protuberant. Skin:  Unable to see sites of tick bites, not visiblee any longer...Marland Kitchenno rash seen.   Impression & Recommendations:  Problem # 1:  TICK BITE (ICD-E906.4) Assessment New Doubt this a problem but will give script for Doxy to use if sxs don't continue to get better as they are today.   Problem # 2:  URI (ICD-465.9) Assessment: New Appears pt has viral illness currently going around. See instructions for trmt. Add Doxy if doesn't improve. His updated medication list for this problem includes:    Adult Aspirin Ec Low Strength 81 Mg Tbec (Aspirin) .Marland Kitchen... Take 1 tablet by mouth once a day  Problem # 3:  HYPERTENSION, BENIGN (ICD-401.1) Assessment: Improved  His updated medication list for this problem includes:    Terazosin Hcl 10 Mg Caps (Terazosin hcl) .Marland Kitchen... Take 1 tablet by mouth once a day    Lisinopril 5 Mg Tabs (Lisinopril) .Marland Kitchen... Take 1 tablet by mouth once a day  BP today: 120/70 Prior BP: 138/78 (08/22/2009)  Labs Reviewed: K+: 5.3 (12/12/2008) Creat: : 1.0 (12/12/2008)   Chol: 187 (12/12/2008)   HDL: 31.20 (12/12/2008)   LDL: DEL (12/06/2007)   TG: 207.0 (12/12/2008)  Problem # 4:  LUMBAGO (ICD-724.2) Assessment: New Recurrence of overuse of back from yard work. Tyl, musc relaxer and if needed pain pill. His updated medication list for this problem includes:    Adult Aspirin Ec Low Strength 81 Mg Tbec (Aspirin) .Marland Kitchen... Take 1 tablet by mouth once a day    Vicodin 5-500 Mg Tabs (Hydrocodone-acetaminophen) ..... One tab by mouth two times a day as needed for back pain    Flexeril 10 Mg Tabs (Cyclobenzaprine hcl) .Marland Kitchen... 1/2 - 1 tab by mouth three times a day as needed for musc spasm  Complete Medication List: 1)  Terazosin Hcl 10 Mg Caps  (Terazosin hcl) .... Take 1 tablet by mouth once a day 2)  Lisinopril 5 Mg Tabs (Lisinopril) .... Take 1 tablet by mouth once a day 3)  Alprazolam 1 Mg Tabs (Alprazolam) .... Take 1/2 tablet by mouth twice daily as needed for anxiety. 4)  Adult Aspirin Ec Low Strength 81 Mg Tbec (Aspirin) .... Take 1 tablet by mouth once a day 5)  Sertraline Hcl 100 Mg Tabs (Sertraline hcl) .Marland Kitchen.. 1 tablet daily by mouth 6)  Nexium 40 Mg Cpdr (Esomeprazole magnesium) .... Take one by mouth daily 7)  Doxycycline Hyclate 100 Mg Caps (Doxycycline hyclate) .... One tab by mouth two times a day 8)  Vicodin 5-500 Mg Tabs (Hydrocodone-acetaminophen) .... One tab by mouth two times a day as needed for back pain 9)  Flexeril 10 Mg Tabs (Cyclobenzaprine hcl) .... 1/2 - 1 tab by mouth three times a day as needed for musc spasm  Patient Instructions: 1)  Take flexeril for back spasm. 2)  Take Vicodin sparingly for back pain. 3)  Take Guaifenesin by [facility] or [facility] and getting MUCOUS RELIEF EXPECTORANT (400mg ), take 11/2 tabs by mouth AM and NOON. 4)  Drink lots of fluids anytime taking Guaifenesin.  5)  Tyl Es 2 tabs by mouth three times a day. 6)  If sxs don't cont to improve, ADD  Doxycycline. Prescriptions: FLEXERIL 10 MG TABS (CYCLOBENZAPRINE HCL) 1/2 - 1 tab by mouth three times a day as needed for musc spasm  #30 x 1   Entered and Authorized by:   Shaune Leeks MD   Signed by:   Shaune Leeks MD on 09/18/2009   Method used:   Print then Give to Patient   RxID:   [id number] VICODIN 5-500 MG TABS (HYDROCODONE-ACETAMINOPHEN) one tab by mouth two times a day as needed for back pain  #20 x 0   Entered and Authorized by:   Shaune Leeks MD   Signed by:   Shaune Leeks MD on 09/18/2009   Method used:   Print then Give to Patient   RxID:   [id number] DOXYCYCLINE HYCLATE 100 MG CAPS (DOXYCYCLINE HYCLATE) one tab by mouth two times a day  #20 x 0    Entered and Authorized by:   Shaune Leeks MD   Signed by:   Shaune Leeks MD on 09/18/2009   Method used:   Print then Give to Patient   RxID:   [id number]   Current Allergies (reviewed today): * RELAFEN (NABUMETONE)

## 2010-06-11 NOTE — Progress Notes (Signed)
Summary: refill request for alprazolam  Phone Note Refill Request   Refills Requested: Medication #1:  ALPRAZOLAM 1 MG TABS Take 1/2 tablet by mouth twice daily as needed for anxiety.   Last Refilled: 02/02/2010 [facility], [phone].  Initial call taken by: Lowella Petties CMA, AAMA,  March 28, 2010 8:54 AM  Follow-up for Phone Call        [facility].              Lowella Petties CMA, AAMA  March 28, 2010 10:49 AM     Prescriptions: ALPRAZOLAM 1 MG TABS (ALPRAZOLAM) Take 1/2 tablet by mouth twice daily as needed for anxiety.  #30 x 5   Entered and Authorized by:   Shaune Leeks MD   Signed by:   Shaune Leeks MD on 03/28/2010   Method used:   Telephoned to ...       [facility] [phone]* (retail)       [address]       [place], [place]  [address]       Ph: [phone]       Fax: [phone]   RxID:   [id number]

## 2010-06-11 NOTE — Procedures (Signed)
Summary: [facility],Dr.Marc Magod  [facility],Dr.Marc Magod   Imported By: Beau Fanny 02/16/2009 13:52:05  _____________________________________________________________________  External Attachment:    Type:   Image     Comment:   External Document  Appended Document: Upper GI Endoscopy,Dr.Marc Magod     Clinical Lists Changes  Observations: Added new observation of PAST SURG HX: [facility] [place] Flu W/ dehydration R Knee Arthroscopy Meniscal Tear  Adenosine Myoview Ischemia due to motion (02/03/2007) FLEX (DR. MAGOD) INTERNAL AND EXTERNAL HEMORRHOIDS SIG. POLYP:(09/29/2001) EGD HH Prob Barretts (Dr Ewing Schlein) (12/19/2008) (02/17/2009 19:34)       Past Surgical History:    [facility] Flu W/ dehydration    R Knee Arthroscopy Meniscal Tear     Adenosine Myoview Ischemia due to motion (02/03/2007)    FLEX (DR. MAGOD) INTERNAL AND EXTERNAL HEMORRHOIDS SIG. POLYP:(09/29/2001)    EGD HH Prob Barretts (Dr Ewing Schlein) (12/19/2008)

## 2010-06-11 NOTE — Letter (Signed)
Summary: [facility]  [facility]   Imported By: Lanelle Bal 02/13/2010 13:19:54  _____________________________________________________________________  External Attachment:    Type:   Image     Comment:   External Document  Appended Document: [facility]     Clinical Lists Changes  Observations: Added new observation of PAST MED HX:  1. Nonobstructive CAD      a.  cath 2/10. LM: 20% mid, distal 30-40%. LAD 30-40% prox, 60% mid          LCX 40% mid, 70% mid to distal. RCA 30%. PDA 50-60%. EF 65%  2. COPD with ongoing tobacco use  3. Hypertension     a.  renal arteries patent on cath  4. Anxiety/depression  5. Severe OSA  6. PAD      a. ABIs R 0.8 L 1.2 Total occlusion of R SFA with distal reconstitution  7. IC and chronic prostatitis per Dr. Patsi Sears  (02/13/2010 23:05)       Past History:  Past Medical History:  1. Nonobstructive CAD      a.  cath 2/10. LM: 20% mid, distal 30-40%. LAD 30-40% prox, 60% mid          LCX 40% mid, 70% mid to distal. RCA 30%. PDA 50-60%. EF 65%  2. COPD with ongoing tobacco use  3. Hypertension     a.  renal arteries patent on cath  4. Anxiety/depression  5. Severe OSA  6. PAD      a. ABIs R 0.8 L 1.2 Total occlusion of R SFA with distal reconstitution  7. IC and chronic prostatitis per Dr. Patsi Sears

## 2010-06-11 NOTE — Progress Notes (Signed)
Summary: refill request for alprazolam  Phone Note Refill Request Message from:  Fax from Pharmacy  Refills Requested: Medication #1:  ALPRAZOLAM 1 MG TABS Take 1/2 tablet by mouth once daily   Last Refilled: 08/01/2008 Faxed request from [facility], phone [phone]. Directions on this script read one three times a day as needed, # 240.  What you ok'd was 1/2 daily, # 60.  Please advise.   Initial call taken by: Lowella Petties CMA,  July 24, 2009 2:43 PM  Follow-up for Phone Call        The script I refilled was the most recent on the chart. I don't like giving a lot of Benzos at one time and want people to use them as sparingly as poss. Shaune Leeks MD  July 24, 2009 3:09 PM  Follow-up by: Lowella Petties CMA,  July 24, 2009 2:54 PM  Additional Follow-up for Phone Call Additional follow up Details #1::        [facility] Additional Follow-up by: Lowella Petties CMA,  July 24, 2009 3:12 PM

## 2010-06-11 NOTE — Letter (Signed)
Summary: [facility]  [facility]   Imported By: Maryln Gottron 03/28/2010 13:52:48  _____________________________________________________________________  External Attachment:    Type:   Image     Comment:   External Document

## 2010-06-11 NOTE — Progress Notes (Signed)
Summary: Rx Sertraline  Phone Note Refill Request Call back at [phone] Message [facility] on July 24, 2009 2:29 PM  Refills Requested: Medication #1:  SERTRALINE HCL 100 MG  TABS 1/2 tablet daily by mouth. Received e-scribe refill request   Method Requested: Electronic Initial call taken by: Sydell Axon LPN,  July 24, 2009 2:29 PM    Prescriptions: ALPRAZOLAM 1 MG TABS (ALPRAZOLAM) Take 1/2 tablet by mouth once daily  #60 x 0   Entered and Authorized by:   Shaune Leeks MD   Signed by:   Shaune Leeks MD on 07/24/2009   Method used:   Telephoned to ...       [facility] [phone]* (retail)       [address]       [place], [place]  [address]       Ph: [phone]       Fax: [phone]   RxID:   [id number] SERTRALINE HCL 100 MG  TABS (SERTRALINE HCL) 1/2 tablet daily by mouth  #45 x 3   Entered and Authorized by:   Shaune Leeks MD   Signed by:   Shaune Leeks MD on 07/24/2009   Method used:   [facility]ron[facility] to        [facility] [phone]* (retail)       [address]       [place], [place]  [address]       Ph: [phone]       Fax: [phone]   RxID:   [id number]

## 2010-07-08 ENCOUNTER — Ambulatory Visit: Payer: Self-pay | Admitting: Internal Medicine

## 2010-07-24 ENCOUNTER — Ambulatory Visit: Payer: Self-pay | Admitting: Internal Medicine

## 2010-08-20 ENCOUNTER — Ambulatory Visit (INDEPENDENT_AMBULATORY_CARE_PROVIDER_SITE_OTHER): Payer: 59 | Admitting: Cardiovascular Disease

## 2010-08-20 ENCOUNTER — Encounter: Payer: Self-pay | Admitting: Cardiovascular Disease

## 2010-08-20 DIAGNOSIS — Z01810 Encounter for preprocedural cardiovascular examination: Secondary | ICD-10-CM

## 2010-08-20 DIAGNOSIS — I2510 Atherosclerotic heart disease of native coronary artery without angina pectoris: Secondary | ICD-10-CM

## 2010-08-20 DIAGNOSIS — E669 Obesity, unspecified: Secondary | ICD-10-CM

## 2010-08-20 DIAGNOSIS — I10 Essential (primary) hypertension: Secondary | ICD-10-CM

## 2010-08-20 DIAGNOSIS — Z87891 Personal history of nicotine dependence: Secondary | ICD-10-CM

## 2010-08-20 DIAGNOSIS — I7389 Other specified peripheral vascular diseases: Secondary | ICD-10-CM

## 2010-08-20 DIAGNOSIS — E785 Hyperlipidemia, unspecified: Secondary | ICD-10-CM

## 2010-08-20 DIAGNOSIS — Z0000 Encounter for general adult medical examination without abnormal findings: Secondary | ICD-10-CM | POA: Insufficient documentation

## 2010-08-20 NOTE — Assessment & Plan Note (Signed)
Blood pressure is well controlled on today's visit. No changes made to the medications. 

## 2010-08-20 NOTE — Assessment & Plan Note (Signed)
We have counseled him on his continued smoking. He reports that after his surgery for gallbladder, he will try to quit smoking. He is reluctant to try chantix given his long history of anxiety and depression.

## 2010-08-20 NOTE — Assessment & Plan Note (Signed)
He would be an acceptable risk for his upcoming microscopic cholecystectomy or open cholecystectomy if needed. No symptoms of angina at this time. No further testing needed.

## 2010-08-20 NOTE — Progress Notes (Signed)
   Patient ID: Richard Burton, male    DOB: October 11, 1955, 55 y.o.   MRN: [id number]  HPI Comments: Richard Burton is a very pleasant 55 year old gentleman with obesity, coronary artery disease, obstructive sleep apnea on CPAP, long history of smoking, anxiety, leg cramping who is a patient of Dr. 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 Katrinka Blazing.  He reports that overall he has been doing well. He does occasionally have leg cramping, some claudication type pain in his right leg though this seems to come on at rest. He is still smoking and has indicated that he would like to quit but does have significant anxiety. No symptoms of chest pain or shortness of breath with exertion. He has been told that he does have some degree of COPD given his long history of smoking.  Previous cardiac catheterization showed 30-40% left main disease, 30-40% proximal LAD disease, 60% mid LAD disease, 40% mid and 70% at the distal left circumflex disease, 30% RCA disease with 50-60% PDA disease, ejection fraction 65%  EKG shows normal sinus rhythm with rate 61 beats per minute with no significant ST or T wave changes     Review of Systems  Constitutional: Negative.   HENT: Negative.   Eyes: Negative.   Respiratory: Negative.   Cardiovascular: Negative.   Gastrointestinal: Negative.   Musculoskeletal: Negative.   Skin: Negative.   Neurological: Negative.   Hematological: Negative.   Psychiatric/Behavioral: The patient is nervous/anxious.   All other systems reviewed and are negative.   BP 126/68  Pulse 61  Ht 5\' 11"  (1.803 m)  Wt 267 lb 1.9 oz (121.165 kg)  BMI 37.26 kg/m2  Physical Exam  Nursing note and vitals reviewed. Constitutional: He is oriented to person, place, and time. He appears well-developed and well-nourished.  HENT:  Head: Normocephalic.  Nose: Nose normal.  Mouth/Throat: Oropharynx is clear and moist.  Eyes:  Conjunctivae are normal. Pupils are equal, round, and reactive to light.  Neck: Normal range of motion. Neck supple. No JVD present.  Cardiovascular: Normal rate, regular rhythm, S1 normal, S2 normal, normal heart sounds and intact distal pulses.  Exam reveals no gallop and no friction rub.   No murmur heard. Pulmonary/Chest: Effort normal and breath sounds normal. No respiratory distress. He has no wheezes. He has no rales. He exhibits no tenderness.  Abdominal: Soft. Bowel sounds are normal. He exhibits no distension. There is no tenderness.  Musculoskeletal: Normal range of motion. He exhibits no edema and no tenderness.  Lymphadenopathy:    He has no cervical adenopathy.  Neurological: He is alert and oriented to person, place, and time. Coordination normal.  Skin: Skin is warm and dry. No rash noted. No erythema.  Psychiatric: He has a normal mood and affect. His behavior is normal. Judgment and thought content normal.           Assessment and Plan

## 2010-08-20 NOTE — Assessment & Plan Note (Signed)
We have encouraged him to work on his weight. This has been a chronic problem

## 2010-08-20 NOTE — Assessment & Plan Note (Signed)
He does have moderate nonobstructive CAD by cardiac catheterization further 2010. We will continue aggressive continued medical management and smoking cessation of possible.

## 2010-08-20 NOTE — Patient Instructions (Signed)
You are doing well. No medication changes were made. Please call us if you have new issues that need to be addressed before your next appt.  We will call you for a follow up Appt.

## 2010-08-20 NOTE — Assessment & Plan Note (Signed)
Significant lower extremity arterial disease. Again we need smoking cessation and good lipid control. We will repeat his lower extremity arterial study early this summer at his request.

## 2010-08-20 NOTE — Assessment & Plan Note (Signed)
We will try to obtain his most recent lipid panel from Dr. Randa Lynn. I did a goal LDL is less than 70

## 2010-08-23 ENCOUNTER — Ambulatory Visit: Payer: Self-pay | Admitting: Surgery

## 2010-08-27 LAB — POCT I-STAT 3, VENOUS BLOOD GAS (G3P V)
Acid-base deficit: 2 mmol/L (ref 0.0–2.0)
O2 Saturation: 66 %
O2 Saturation: 74 %
TCO2: 22 mmol/L (ref 0–100)
TCO2: 24 mmol/L (ref 0–100)
pCO2, Ven: 39.2 mmHg — ABNORMAL LOW (ref 45.0–50.0)
pO2, Ven: 42 mmHg (ref 30.0–45.0)

## 2010-08-27 LAB — POCT I-STAT 3, ART BLOOD GAS (G3+)
O2 Saturation: 96 %
pCO2 arterial: 36 mmHg (ref 35.0–45.0)
pO2, Arterial: 85 mmHg (ref 80.0–100.0)

## 2010-08-30 ENCOUNTER — Ambulatory Visit: Payer: Self-pay | Admitting: Surgery

## 2010-09-02 LAB — PATHOLOGY REPORT

## 2010-09-24 ENCOUNTER — Encounter: Payer: Self-pay | Admitting: Cardiovascular Disease

## 2010-09-24 ENCOUNTER — Other Ambulatory Visit: Payer: Self-pay | Admitting: Family Medicine
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
## 2010-09-24 NOTE — Assessment & Plan Note (Signed)
[facility] OFFICE NOTE   Richard Burton, Richard Burton                     MRN:          [id number]  DATE:06/23/2008                            DOB:          November 03, 1955    INTERVAL HISTORY:  Richard Burton is a very pleasant 55 year old male with a  history of obesity, COPD with ongoing tobacco use, hypertension and  obstructive sleep apnea.   In 2008, he underwent a stress test and echocardiogram.  Echo was  normal, Myoview showed an EF of 64%.  There was some question of infarct  with some mild ischemia.  Though, this was thought to be diaphragmatic  attenuation.  He also, at that time, had symptoms concerning for  obstructive sleep apnea.  I sent him for a sleep study, had severe sleep  apnea, and he was placed on CPAP.   He returns today for follow-up.  He says that over the past few months  he has been getting more short of breath with exertion.  He also has  episodes of acute onset of shortness of breath and some chest tightness  which he attributes to anxiety.  He also notes that he at times gets  some neck pain in the left neck which is getting worse.  This can happen  at any time.  But, it feels like it is clearly getting worse.  He,  however has been able to work, and does not have reproducible angina.  Though he does, as I said above, get dyspnea.   He also notes that he gets cramping in his legs when he walks.  This is  concerning for claudication.  He has not had any sores.  He has not had  any heart failure symptoms.  He is having trouble with his sleep apnea  mask, as he often has to get out of bed at night to go to the bathroom.   Unfortunately, he continues to smoke at least one-pack per day.   CURRENT MEDICATIONS:  1. Terazosin 10 mg at night.  2. Lisinopril 5 a day.  3. Xanax 1/2 mg a day.  4. Zoloft 50 a day.  5. Prilosec 20 a day.  6. Aspirin 81 a day.   PHYSICAL EXAMINATION:  GENERAL:  He is a large  man, no acute distress.  Ambulates around the clinic without any respiratory difficulty.  VITAL SIGNS:  Blood pressure is 114/76, heart rate 63, weight is 253.  HEENT:  Normal except for a thick neck.  NECK:  Neck is supple and thick as above.  No JVD, carotids are 2+  bilaterally without any bruits.  There is no lymphadenopathy or  thyromegaly.  CARDIAC:  He has distant heart sounds.  He is regular.  No obvious  murmurs.  LUNGS:  Clear with prolonged expiratory phase.  No wheezes or rales.  ABDOMEN:  Soft, obese, nontender, nondistended.  No obvious past  splenomegaly.  No bruits.  No masses.  EXTREMITIES:  Warm with no cyanosis, clubbing or edema.  No sores.  DP  pulses are 1+ bilaterally.  NEUROLOGICAL:  Alert and  oriented x3.  Cranial nerves II-XII are intact.  Moves all four extremities without difficulty.  Affect is pleasant.  EKG  shows normal sinus rhythm.  Rate of 63.  No ST-T wave abnormalities.   ASSESSMENT/PLAN:  1. Shortness of breath and chest discomfort.  With mildly abnormal      stress test.  Given his progressive symptoms and partially abnormal      stress test, I think it is reasonable to proceed with cardiac      catheterization.  We did discuss the possibilities of continuous      medical therapy, but both he and his wife are quite concerned about      his symptoms, and we have agreed to proceed.  We will do right and      left heart catheterization as well as abdominal aortogram.  2. Hypertension, well-controlled.  3. Tobacco use.  I counseled him on the need to quit.  4. Sleep apnea.  Once again, I counseled him on the need to be as      compliant as possible with his CPAP.  5. Claudication.  We will check ABIs with lower extremity ultrasound      as well as doing abdominal aortogram.   DISPOSITION:  Pending the results of his catheterization.     Bevelyn Buckles. Bensimhon, MD  Electronically Signed    DRB/MedQ  DD: 06/23/2008  DT: 06/23/2008  Job #: [id number]

## 2010-09-24 NOTE — Cardiovascular Report (Signed)
NAMESADIE, PICKAR NO.:  [id number]   MEDICAL RECORD NO.:  [id number]          PATIENT TYPE:  OIB   LOCATION:  1962                         FACILITY:  [facility]   PHYSICIAN:  Richard Burton, MDDATE OF BIRTH:  June 10, 1955   DATE OF PROCEDURE:  06/30/2008  DATE OF DISCHARGE:  06/30/2008                            CARDIAC CATHETERIZATION   THE PATIENT IDENTIFICATION:  Richard Burton is a very pleasant 55 year old male  with a history of hypertension, sleep apnea, and COPD with ongoing  tobacco use.  He had a stress test in 2008 for chest pain.  A Myoview  showed an EF of 64% with some question of inferior infarct with peri-  infarct ischemia.  This was thought to be diaphragmatic attenuation.   I saw him back in clinic last week and he was continued to have  exertional dyspnea and intermittent chest pain, which was suspicious for  ischemic heart disease.  He was also having some exertional leg pain,  which I thought might be claudication.  He is brought today for right  and left heart catheterization.  This was done in the outpatient lab.   PROCEDURES PERFORMED:  1. Right heart catheterization.  2. Selective coronary angiography.  3. Left heart catheterization.  4. Left ventriculogram.  5. Abdominal aortogram.   DESCRIPTION OF THE PROCEDURE:  The risks and indication were explained,  consent was signed, and placed on the chart.  Right groin area was  prepped and draped in a routine sterile fashion.  A 7-French venous  sheath was placed in the right femoral vein using modified Seldinger  technique.  A 4-French arterial sheath was placed in the right femoral  artery.  A standard Swan-Ganz catheter was used for right heart cath.  A  JL4, 3DRC, and a angled pigtail were used for the left heart cath.  There are no apparent complications.  All catheter exchanges made over  wire.   HEMODYNAMICS:  The right atrium, mean pressure of 5.  RV 32/6.  PA 24/9  with a mean of 15.   Pulmonary capillary wedge pressure mean of 4.  Central aortic pressure 108/67 with a mean of 86.  LV pressure 108/11  with an EDP of 14.  Fick cardiac output was 4.6.  Cardiac index 2.0.  There is no aortic stenosis.   Left main had a 20% mid lesion and a distal 30-40% lesion.   LAD coursed to the apex.  It gave off 2 diagonal branches.  There was a  diffuse 30-40% stenosis in the proximal LAD and 60% stenosis in the  midsection.   Left circumflex gave off a large ramus branch, a small OM-1, and a  moderate-sized OM-2.  There was mild diffuse disease throughout.  There  is a focal 40% lesion in the mid AV groove circumflex and a focal 70%  lesion in the mid to distal AV groove circumflex between the OM-1 and OM-  2.  There was a 40% lesion in the proximal portion of the OM-3.   Right coronary artery was a large dominant vessel and gave off an acute  marginal branch, a PDA, and 2 posterolaterals, had diffuse 30% stenosis  throughout the proximal midsection.  In the ostium of PDA, there was a  50-60% lesion.   Left ventriculogram done in the RAO position showed an EF of 65% with no  regional wall motion abnormalities.   Abdominal aorta showed a widely patent renal arteries bilaterally with  no significant abdominal aortoiliac disease.   ASSESSMENT:  1. Moderate diffuse nonobstructive coronary artery disease.  We took a      close look at the lesion in the mid to distal atrioventricular      groove circumflex, and I do not believe this is obstructive.  2. Normal left ventricular function.  3. Normal right heart pressures.  4. No evidence of peripheral arterial disease on abdominal aortogram.   PLAN:  I suspect his symptoms are noncardiac.  He will need aggressive  medical therapy to prevent progression of his coronary atherosclerosis.  I made it clear to him the fact that he really needs to stop smoking.   DISPOSITION:  We will see him back in clinic for further  followup.      Richard Buckles. Bensimhon, MD  Electronically Signed     DRB/MEDQ  D:  06/30/2008  T:  07/01/2008  Job:  [id number]

## 2010-09-24 NOTE — Assessment & Plan Note (Signed)
[facility] OFFICE NOTE   SHAWNEE, HIGHAM                       MRN:          [id number]  DATE:01/22/2007                            DOB:          [birth year]    REASON FOR CONSULTATION:  Chest pain and shortness of breath.   INTERVAL HISTORY:  [name] is a very pleasant 55 year old male  with no known history of coronary artery disease who has never had a  stress test of catheterization.  He does have a history of tobacco use  which is ongoing, depression/anxiety, hypertension, and obesity.  There  is no clear history of hyperlipidemia.   Over the past 1-2 months, he noticed that he has been getting cramps in  his epigastrium and lower chest.  This can happen at any time.  He does  get nauseated with this.  Symptoms happen about 1-2 times a week.  They  are not associated with any reflux symptoms but do occur with some  shortness of breath.  He also has been noticing some pain in his right  shoulder which does not necessarily correspond with his other pain.  He  has some dyspnea with exertion but this has not changed recently.  And  he is still able to mow a yard, push mowing without any chest pain.  His  wife does note that he snores quite heavily and sometimes stops  breathing.  He has had episodes where he has woken up with shortness of  breath and chest pain.   REVIEW OF SYSTEMS:  He denies fever or chills.  No cough.  He has  occasional palpitations as well as headaches, also occasional episodes  of dizziness  but no syncope.  The remainder of the review of systems is  negative except for HPI and problem list.   PROBLEM LIST:  1. Tobacco use, ongoing.  2. Obesity.  3. Anxiety/depression.  4. Hypertension.  5. Headaches.   CURRENT MEDICATIONS:  1. Terazosin 10 mg a day.  2. Lisinopril 5 mg a day.  3. Alprazolam 0.5 mg a day.  4. Zoloft 50 a day.  5. Omeprazole 20 a day.  6. Aspirin 81  a day.   ALLERGIES:  No known drug allergies.  He did get nausea and felt funny  with IV contrast.   SOCIAL HISTORY:  He is married with one 53 year old boy.  He works as a  Surveyor, minerals.  He has a history of tobacco 1 pack per day  x23 years, ongoing.  No significant alcohol intake.   FAMILY HISTORY:  Negative for premature coronary artery disease.   PHYSICAL EXAMINATION:  GENERAL:  He is well-appearing in no acute  distress.  He ambulates around the clinic without any respiratory  difficulty.  VITAL SIGNS:  Blood pressure is 124/66, heart rate is 59, weight is 247.  HEENT:  Normal.  NECK:  Supple.  No  JVD.  Carotids are 2 plus bilaterally without any  bruits.  There is no lymphadenopathy or thyromegaly.  CARDIAC:  PMI is nondisplaced.  He has a regular  rate and rhythm with an  S4.  No murmur.  LUNGS:  Clear.  ABDOMEN:  Central obesity.  He is nontender, nondistended.  No  hepatosplenomegaly.  No bruits.  No masses appreciated.  Good bowel  sounds.  EXTREMITIES:  Warm with no cyanosis, clubbing, or edema.  He has a  surgical scar on his left knee.  Good distal pulses.  No rash.  No  ulceration.  NEUROLOGIC:  He is alert and oriented x3.  Cranial nerves II-XII are  intact.  He moves all 4 extremities without difficulty.  Affect is  pleasant.   EKG shows a sinus bradycardia at a rate of 59 with left atrial  enlargement.  No acute ST-T wave abnormalities.   ASSESSMENT/PLAN:  1. Chest pain and dyspnea.  This has both atypical and typical      features.  He does have multiple cardiac risk factors.  We did      review the possibility of either going right to cardiac      catheterization or starting with an 2D echocardiogram and Myoview.      At this point, we will start with the echocardiogram and exercise      test.  I have also made it very clear to him that it is important      to start controlling his risk factors including beginning a routine      walking  program after his stress test, stopping smoking, and losing      weight.  2. Hypertension, well controlled.  3. Tobacco use.  As above, I made it very clear to him that he needs      to stop smoking.  4. Probable sleep apnea.  We will refer to Dr. Craige Cotta for a sleep study.   DISPOSITION:  We will see him back in 1 month to see how he is doing and  review the results of his stress test.     Bevelyn Buckles. Bensimhon, MD  Electronically Signed    DRB/MedQ  DD: 01/22/2007  DT: 01/22/2007  Job #: [id number]   cc:   Coralyn Helling, MD

## 2010-09-24 NOTE — Procedures (Signed)
NAME:  Richard Burton, MARIANI NO.:  [id number]   MEDICAL RECORD NO.:  [id number]          PATIENT TYPE:  OUT   LOCATION:  SLEEP CENTER                 FACILITY:  [facility]   PHYSICIAN:  Coralyn Helling, MD        DATE OF BIRTH:  1956/01/31   DATE OF STUDY:  03/05/2007                            NOCTURNAL POLYSOMNOGRAM   REFERRING PHYSICIAN:  Coralyn Helling, MD   INDICATION FOR STUDY:  This individual has a history of hypertension and  chronic headaches as well as sleep disruption and excess of daytime  sleepiness.  He is referred to the sleep lab for evaluation of  hypersomnia with obstructive sleep apnea.   EPWORTH SLEEPINESS SCORE:  13   MEDICATIONS:  Lisinopril, Terazocine, Xanax, Zoloft, omeprazole, and  aspirin.   SLEEP ARCHITECTURE:  Total recording time was 436 minutes.  Total sleep  time was 355 minutes.  Sleep efficiency was 81%.  Sleep latency was 14  minutes.  REM latency was 207 minutes which was prolonged.  The patient  was not observed in slow wave sleep but was observed in all other stages  of sleep.  The patient slept in both the supine and nonsupine position.  He spent the majority of the time in the supine position.   RESPIRATORY DATA:  The patient followed a split night study protocol.  The average respiratory rate was 18.  During the diagnostic portion of  the test, the overall apnea/hypopnea index was 52.  The events were  exclusively obstructive in nature.  Loud snoring was noted by the  technician.  During the therapeutic portion of the test, the patient was  titrated from a CPAP ratio setting of 4 to 16 cm of water.  At a CPAP  pressure setting of 12 cm of water, the apnea/hypopnea index was reduced  to 4.1.  At this pressure setting, the patient was observed in both REM  sleep and supine sleep, and snoring was eliminated.   OXYGEN DATA:  The baseline oxygenation was 95%.  The oxygen saturation  nadir was 84%.  At a CPAP pressure setting of 12 cm of  water, the lowest  oxygenation was 93%.   CARDIAC DATA:  The average heart rate was 55, and the rhythm strip  showed normal sinus rhythm.   MOVEMENT-PARASOMNIA:  The periodic limb movement index was 0.  The  patient had 1 restroom trip.   IMPRESSIONS-RECOMMENDATIONS:  This was a split night study protocol.  During the diagnostic portion of the test, the patient was found to have  severe obstructive sleep apnea with an apnea/hypopnea index of 52 and an  oxygen saturation nadir of 84%.  During the therapeutic portion of the  test, he was titrated to a CPAP pressure  setting of 12 cm of water with a reduction in his apnea-hypopnea index  to 4.1.  At this pressure setting, he was observed in both REM sleep,  supine sleep, snoring was eliminated, and his oxygenation stabilized.      Coralyn Helling, MD  Diplomat, American Board of Sleep Medicine  Electronically Signed     VS/MEDQ  D:  03/22/2007 10:04:42  T:  03/22/2007 12:29:38  Job:  [id number]

## 2010-09-27 NOTE — Op Note (Signed)
[facility]  Patient:    Richard Burton, Richard Burton Visit Number: [id number] MRN: [id number]          Service Type: DSU Location: DAY Attending Physician:  Lurene Shadow. Date: 01/07/01 Admit Date:  01/07/2001   CC:         Laurita Quint, M.D. [facility]   Operative Report  PREOPERATIVE DIAGNOSES.  Chronic prostatitis/interstitial cystitis.  POSTOPERATIVE DIAGNOSIS:  Chronic prostatitis/interstitial cystitis.  OPERATION:  Cystourethroscopy, hydrodistention of the bladder, bladder biopsy, cauterization of biopsy sites, insufflation of Marcaine and Pyridium in the bladder.  SURGEON:  Sigmund I. Patsi Sears, M.D.  ANESTHESIA:  General (LMA), B&O suppository, IV Toradol.  PREPARATION:  After appropriate preanesthesia, the patient was brought to the operating room, placed on the operating table in the dorsal supine position where general LMA anesthesia was introduced.  He was then re-placed in the dorsal lithotomy position where the pubis was prepped with Betadine solution and draped in the usual fashion.  DESCRIPTION OF PROCEDURE:  Cystourethroscopy revealed normal-appearing urethra.  The bladder appeared normal as well.  The bladder neck appeared normal as well.  There appeared to be some thickness of the bladder neck, but the scope easily penetrated the bladder neck area.  There was no evidence of bladder stone, tumor, or diverticular formation.  The bladder held 600 cc, and was unable to be hydrodistended above that.  A biopsy of the bladder was taken, and the biopsy sites were cauterized.  Marcaine and Pyridium was inserted into the bladder.  The patient received a B&O suppository as well as 30 mg of IV Toradol.  He was awakened and taken to the recovery room in good condition. Attending Physician:  Laqueta Jean DD:  01/07/01 TD:  01/07/01 Job: [id number]

## 2010-09-27 NOTE — Letter (Signed)
March 13, 2006     Sigmund I. Patsi Sears, M.D.  [address]  [place], [place] [address]   RE:  GAL, FELDHAUS  MRN:  [id number]  /  DOB:  1955-10-18   Dear Carol Ada,   This is in regard to a patient we share by the name of Richard Burton, a 55-  year-old white male, who has interstitial cystitis and has recurrent pains  with intercourse, possibly prostatitis. Wondering if he could have a  fistulous tract between GI and GU systems, possibly in the right middle  quadrant of the belly where he gets his pain after intercourse. I am not  sure that is an easy thing to check for and I do not mean to try and tell  you what to do. His pain seems very consistent with either not being on  antibiotics for a while or after having sex routinely. He is helped  significantly by antibiotics, it gets worse when he does not. His symptoms  also include GI tract symptoms such as nausea, fever, vomiting, sometimes  diarrhea, sometimes blood in the stool. Thanks very much for seeing him and  taking care of him.    Sincerely,     ______________________________  Arta Silence, MD    RNS/MedQ  DD: 03/13/2006  DT: 03/13/2006  Job #: [id number]

## 2010-11-08 ENCOUNTER — Other Ambulatory Visit: Payer: Self-pay | Admitting: *Deleted

## 2010-11-09 ENCOUNTER — Telehealth: Payer: Self-pay | Admitting: *Deleted

## 2010-11-09 NOTE — Telephone Encounter (Signed)
It was Alprazolam 1 mg, take 1/2 by mouth twice a day as needed for anxiety.

## 2010-11-09 NOTE — Telephone Encounter (Signed)
Received a faxed refill request from pharmacy. Is it okay to refill?

## 2010-11-09 NOTE — Telephone Encounter (Signed)
Please call in Alprazolam 1 mg, take 1/2 by mouth twice a day as needed for anxiety. #60, 2rf and have him schedule f/u with Dr. Hetty Ely.  Thanks.

## 2010-11-09 NOTE — Telephone Encounter (Signed)
Which medicine is it?  I couldn't tell from the note?  Please just put it on my dest and I'll handle it Monday.

## 2010-11-10 MED ORDER — ALPRAZOLAM 1 MG PO TABS: 0.5000 mg | ORAL_TABLET | Freq: Two times a day (BID) | ORAL | Status: DC

## 2010-11-10 NOTE — Telephone Encounter (Signed)
Please call in Alprazolam 1 mg, take 1/2 by mouth twice a day as needed for anxiety. #60, 2rf and have him schedule f/u with Dr. Hetty Ely.  See prev phone note.  This may have already been done.  Thanks.

## 2010-11-11 NOTE — Telephone Encounter (Signed)
Spoke to patient and was advised that he has switched to Dr. Randa Lynn at [facility].  Pharmacy notified to contact his new doctor for this refill.

## 2010-11-11 NOTE — Telephone Encounter (Signed)
Spoke to patient and was advised that he has switched to Dr. Randa Lynn at [facility]. Called pharmacy and spoke to Aynor and advised him of this. Refill denied since patient has switched physicians.

## 2010-12-12 ENCOUNTER — Ambulatory Visit: Payer: Self-pay | Admitting: Cardiology

## 2011-01-19 ENCOUNTER — Other Ambulatory Visit: Payer: Self-pay | Admitting: Family Medicine

## 2012-09-26 IMAGING — US US ABDOMEN LIMITED SLG ORGAN/ASCITES
1 series · 17 of 25 positions shown · non-contrast
Comparison: none

REASON FOR EXAM: RUQ abd pain eval for gallbladder
COMMENTS:

[Series 1: us abdomen limited slg organ/ascites · 17 of 63 slices shown]
[im 1/63]
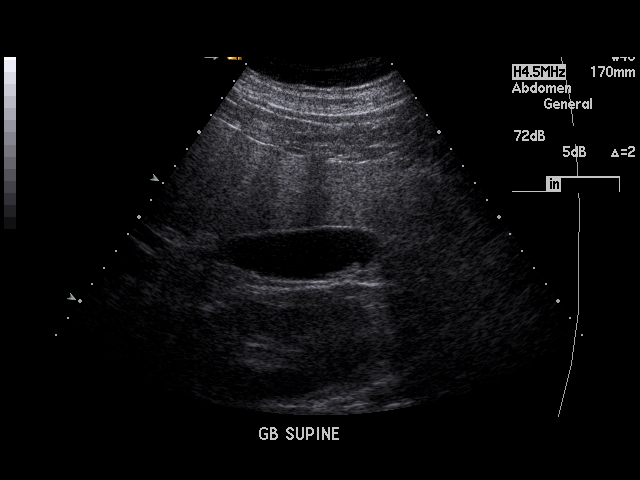
[im 6/63]
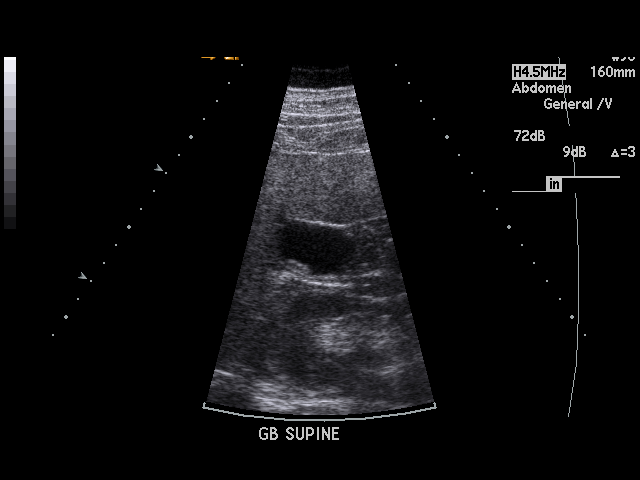
[im 8/63]
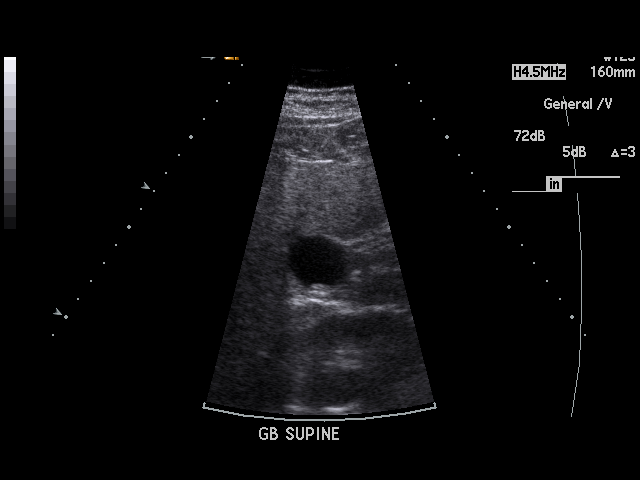
[im 13/63]
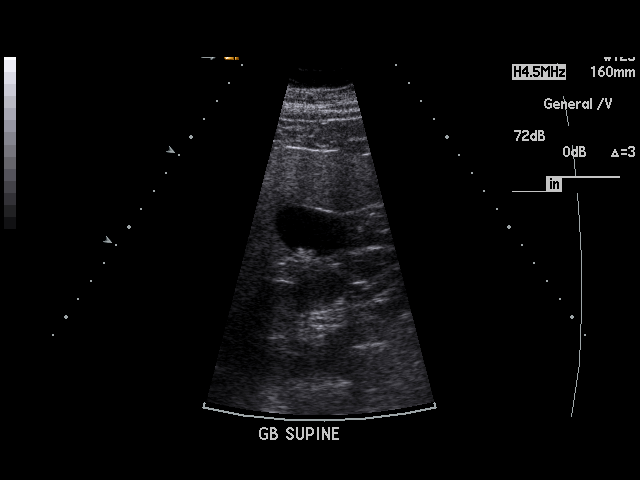
[im 16/63]
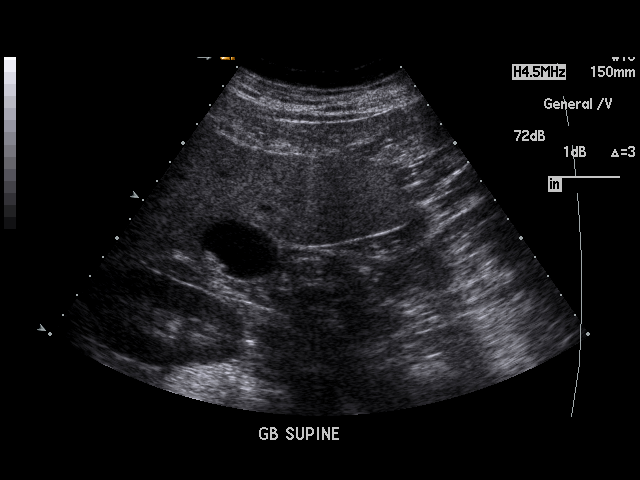
[im 21/63]
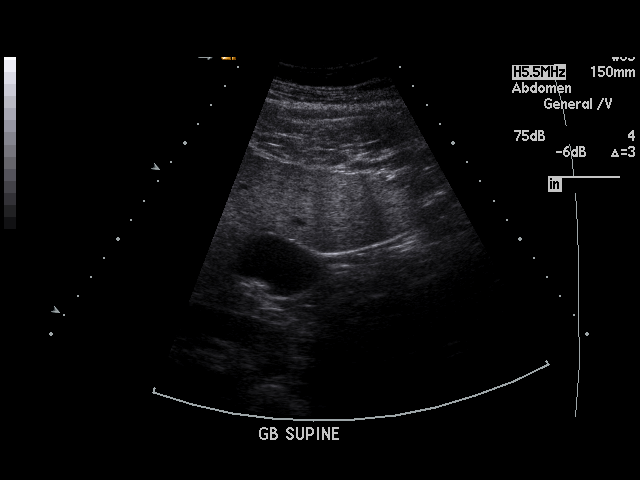
[im 24/63]
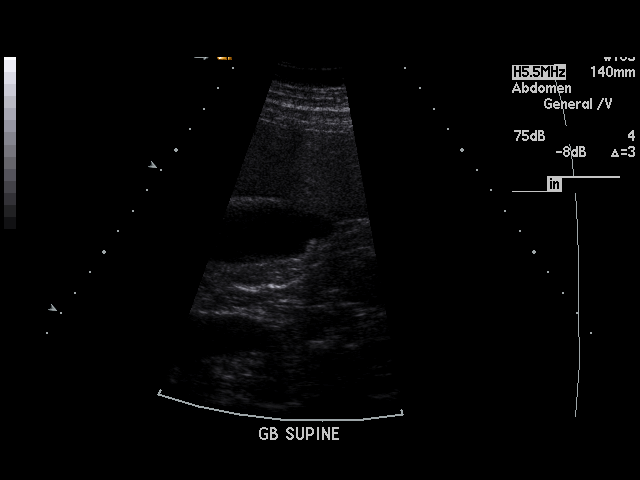
[im 29/63]
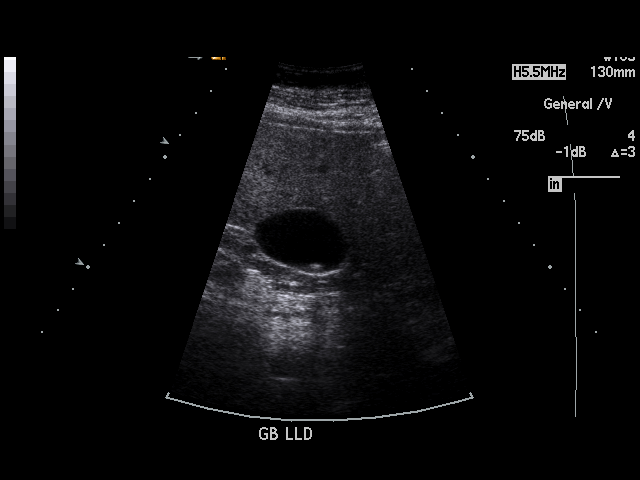
[im 32/63]
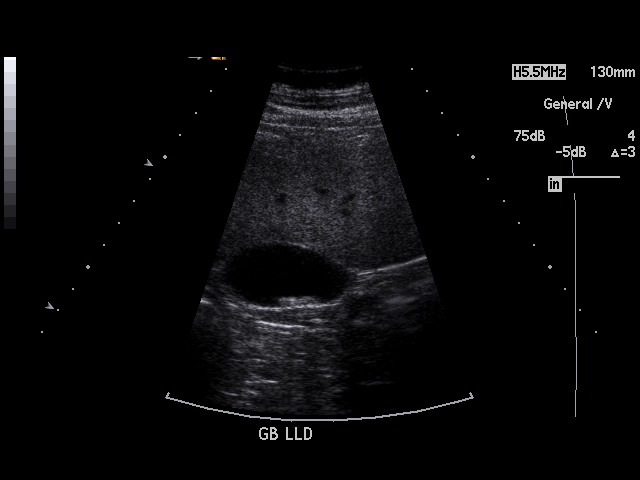
[im 34/63]
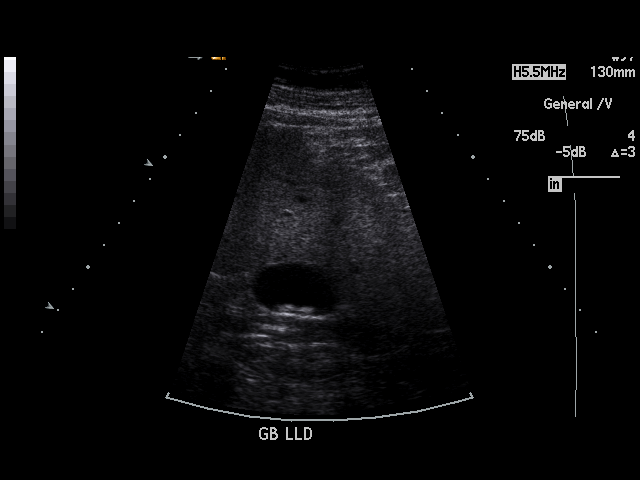
[im 39/63]
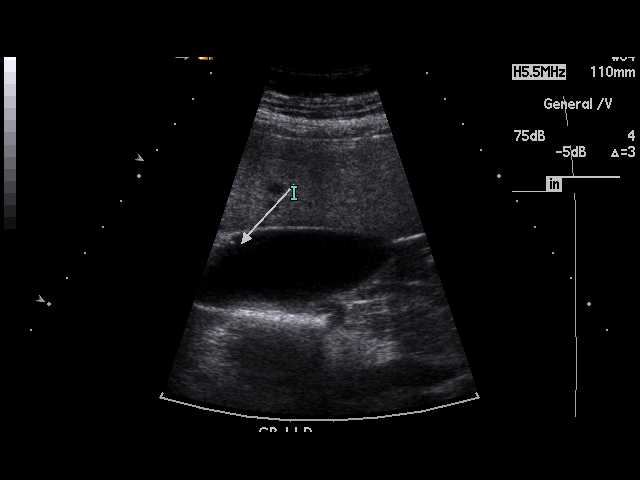
[im 42/63]
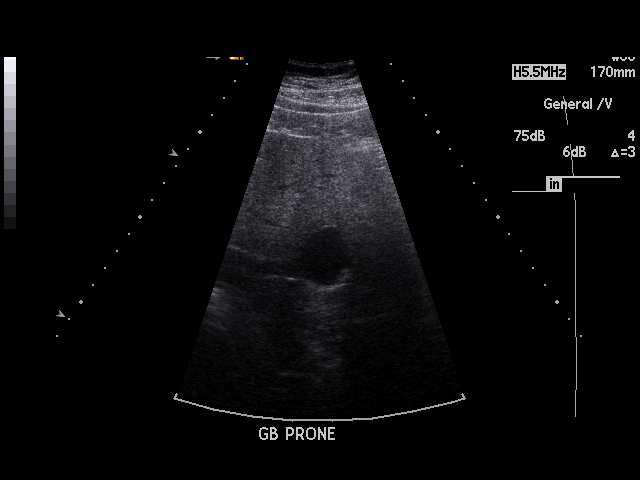
[im 47/63]
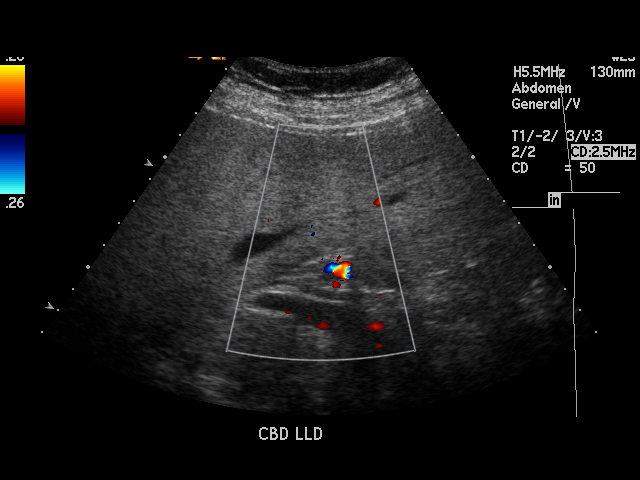
[im 50/63]
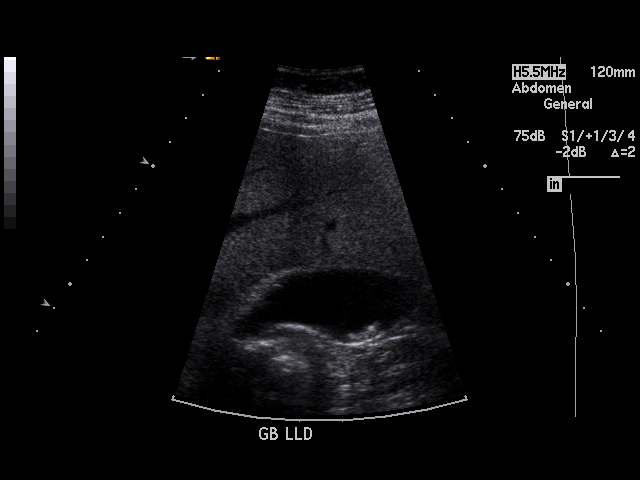
[im 55/63]
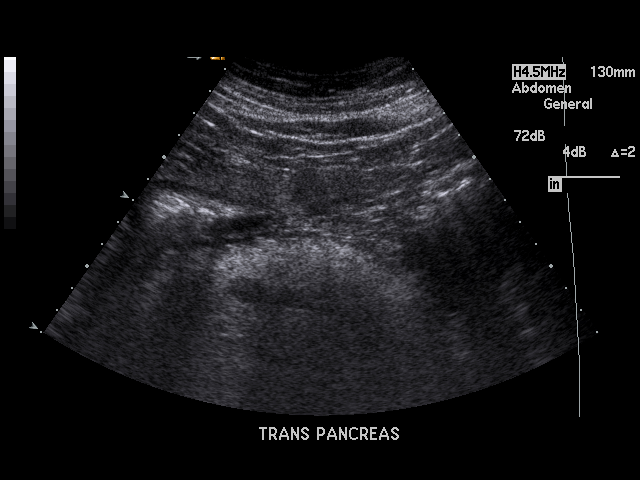
[im 57/63]
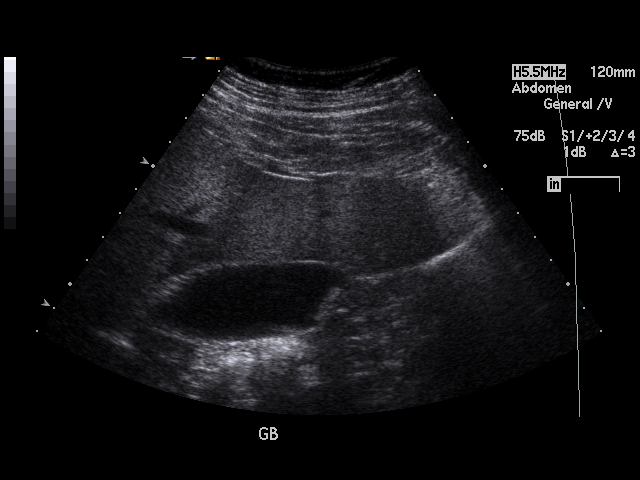
[im 63/63]
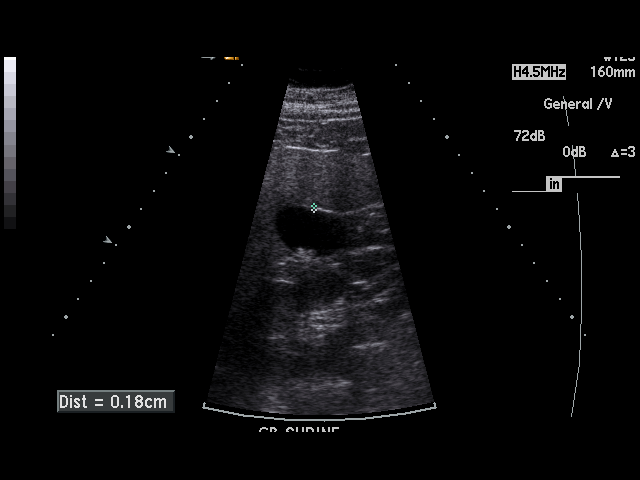

[17 of 25 positions shown; findings below may reference images not displayed]

PROCEDURE:     DULLA - DULLA ABDOMEN LTD 1 ORGAN OR QUAD  - July 24, 2010 [DATE]

RESULT:     Limited ultrasound examination targeted to the gallbladder was
performed as requested. Comparison is made to a prior exam of July 08, 2010. The current exam again shows multiple mobile echodensities in the
gallbladder. On this exam there is faint shadowing associated with some of
the densities. The echodensities therefore are thought to likely represent
gallstones even though shadowing is not strong.

There is also noted a 2.6 mm echodensity associated with the anterior
gallbladder wall. This does not move as the patient changes position and is
consistent with a tiny polyp. There is no thickening of the gallbladder wall
which measures 1.8 mm in thickness. The common bile duct measures 3.4 mm in
diameter which is within normal limits.
IMPRESSION: 1. Probable cholelithiasis.
2. There is a tiny polyp of the anterior gallbladder wall.

## 2013-02-09 HISTORY — PX: CORONARY ARTERY BYPASS GRAFT: SHX141

## 2013-02-23 ENCOUNTER — Ambulatory Visit: Payer: Self-pay | Admitting: Cardiology

## 2013-03-03 DIAGNOSIS — Z951 Presence of aortocoronary bypass graft: Secondary | ICD-10-CM | POA: Insufficient documentation

## 2013-03-08 DIAGNOSIS — I82409 Acute embolism and thrombosis of unspecified deep veins of unspecified lower extremity: Secondary | ICD-10-CM | POA: Insufficient documentation

## 2013-10-26 ENCOUNTER — Other Ambulatory Visit: Payer: Self-pay | Admitting: Psychiatry

## 2013-10-26 LAB — TSH: Thyroid Stimulating Horm: 1.17 u[IU]/mL

## 2013-10-26 LAB — FOLATE: Folic Acid: 14.2 ng/mL (ref 3.1–100.0)

## 2013-12-14 DIAGNOSIS — F429 Obsessive-compulsive disorder, unspecified: Secondary | ICD-10-CM | POA: Insufficient documentation

## 2013-12-14 DIAGNOSIS — K635 Polyp of colon: Secondary | ICD-10-CM | POA: Insufficient documentation

## 2014-01-30 ENCOUNTER — Ambulatory Visit (INDEPENDENT_AMBULATORY_CARE_PROVIDER_SITE_OTHER): Payer: BC Managed Care – PPO | Admitting: Cardiovascular Disease

## 2014-01-30 ENCOUNTER — Encounter: Payer: Self-pay | Admitting: Cardiovascular Disease

## 2014-01-30 VITALS — BP 130/80 | HR 51 | Ht 71.0 in | Wt 234.0 lb

## 2014-01-30 DIAGNOSIS — Z720 Tobacco use: Secondary | ICD-10-CM

## 2014-01-30 DIAGNOSIS — I10 Essential (primary) hypertension: Secondary | ICD-10-CM

## 2014-01-30 DIAGNOSIS — E785 Hyperlipidemia, unspecified: Secondary | ICD-10-CM

## 2014-01-30 DIAGNOSIS — I739 Peripheral vascular disease, unspecified: Secondary | ICD-10-CM

## 2014-01-30 DIAGNOSIS — I2510 Atherosclerotic heart disease of native coronary artery without angina pectoris: Secondary | ICD-10-CM

## 2014-01-30 DIAGNOSIS — F17200 Nicotine dependence, unspecified, uncomplicated: Secondary | ICD-10-CM

## 2014-01-30 MED ORDER — ATORVASTATIN CALCIUM 20 MG PO TABS: 20.0000 mg | ORAL_TABLET | Freq: Every day | ORAL | Status: DC

## 2014-01-30 MED ORDER — METOPROLOL SUCCINATE ER 25 MG PO TB24: 12.5000 mg | ORAL_TABLET | Freq: Every day | ORAL | Status: DC

## 2014-01-30 NOTE — Progress Notes (Signed)
History of Present Illness: 58 yo male with history of CAD s/p PCI/stent followed by 4V CABG October 2014, PAD, HTN, COPD, tobacco abuse, HLD, sleep apnea here today as a new patient to establish cardiology care. He has been seen in our [place] office in April 2012 by Dr. Rockey Situ and has been followed over the last year by Dr. Saralyn Pilar in [place]. Admitted to [facility] October 2010 with chest pain and underwent 4V CABG. I do not have records to review. He continues to smoke daily. Also has COPD and OSA. He is known to have PAD with occluded right SFA. No recent lower extremity testing.   Overall feeling well. He does describe muscle fatigue with exertion. NO change in this over last 6 months. Thinks his statin may be contributing. Feels fatigued all day and sleeps most of his time when at home. No chest pain, near syncope, syncope, dyspnea, LE edema.   Primary Care Physician: Dr. Miguel Aschoff  Last Lipid Profile: Followed in primary care.   Past Medical History  Diagnosis Date  . CAD (coronary artery disease)     s/p 4V CABG 2014  . COPD (chronic obstructive pulmonary disease)     ongoing tobacco use  . Hypertension     renal arteries patent on cath  . Anxiety and depression   . OSA (obstructive sleep apnea)     severe  . PAD (peripheral artery disease)     ABIs R 0.8 L 1.2 Total occlusion of right SFA with distal reconstitution   . Chronic prostatitis   . Hyperlipidemia   . Tobacco abuse     Past Surgical History  Procedure Laterality Date  . Coronary artery bypass graft  October 2014    4V  . Knee arthroscopy      right meniscal tear  . Cholecystectomy    . Cystoscopy      Current Outpatient Prescriptions  Medication Sig Dispense Refill  . ALPRAZolam (XANAX) 1 MG tablet Take 0.5 tablets (0.5 mg total) by mouth 2 (two) times daily.  60 tablet  2  . aspirin 81 MG EC tablet Take 81 mg by mouth daily.        Marland Kitchen atorvastatin (LIPITOR) 40 MG tablet       . clopidogrel  (PLAVIX) 75 MG tablet       . esomeprazole (NEXIUM) 40 MG capsule Take by mouth.      Marland Kitchen lisinopril (PRINIVIL,ZESTRIL) 5 MG tablet TAKE 1 TABLET EVERY DAY  30 tablet  0  . metoprolol succinate (TOPROL-XL) 25 MG 24 hr tablet Take 12.5 mg by mouth 2 (two) times daily.      . pentosan polysulfate (ELMIRON) 100 MG capsule Take 200 mg by mouth daily.        . sertraline (ZOLOFT) 50 MG tablet Take 150 mg by mouth daily.       Marland Kitchen terazosin (HYTRIN) 10 MG capsule Take 10 mg by mouth at bedtime.         No current facility-administered medications for this visit.    Allergies  Allergen Reactions  . Relafen [Nabumetone]     History   Social History  . Marital Status: Married    Spouse Name: N/A    Number of Children: N/A  . Years of Education: N/A   Occupational History  . Not on file.   Social History Main Topics  . Smoking status: Current Every Day Smoker -- 1.00 packs/day for 40 years    Types:  Cigarettes  . Smokeless tobacco: Never Used  . Alcohol Use: No  . Drug Use: No  . Sexual Activity: Not on file   Other Topics Concern  . Not on file   Social History Narrative  . No narrative on file    Family History  Problem Relation Age of Onset  . Hypertension Mother   . Cataracts Mother   . Diabetes Father     Review of Systems:  As stated in the HPI and otherwise negative.   BP 130/80  Pulse 51  Ht 5\' 11"  (1.803 m)  Wt 234 lb (106.142 kg)  BMI 32.65 kg/m2  Physical Examination: General: Well developed, well nourished, NAD HEENT: OP clear, mucus membranes moist SKIN: warm, dry. No rashes. Neuro: No focal deficits Musculoskeletal: Muscle strength 5/5 all ext Psychiatric: Mood and affect normal Neck: No JVD, no carotid bruits, no thyromegaly, no lymphadenopathy. Lungs:Clear bilaterally, no wheezes, rhonci, crackles Cardiovascular: Regular rate and rhythm. No murmurs, gallops or rubs. Abdomen:Soft. Bowel sounds present. Non-tender.  Extremities: No lower extremity  edema. Pulses are 2 + in the bilateral DP/PT.  EKG: Sinus brady, rate 51 bpm. LAD, Incomplete RBBB  Assessment and Plan:   1. CAD: s/p CABG in October 2014. He is stable. He is on good medical therapy. He is on a ASA, Plavix, statin, Lopressor and Ace-inh.   2. HTN: BP controlled. No changes.   3. HLD: Continue statin but will lower Lipitor to 20 mg daily due to muscle aches.   4. Tobacco abuse: Smoking cessation recommended. He is trying to stop  5. PAD: Known to have occluded right SFA. Now with increased pain right leg with walking. Will arrange lower ext arterial dopplers.   Will get records from [facility].

## 2014-01-30 NOTE — Patient Instructions (Signed)
Your physician recommends that you schedule a follow-up appointment in:  3 months.   Your physician has requested that you have a lower or upper extremity arterial duplex. This test is an ultrasound of the arteries in the legs or arms. It looks at arterial blood flow in the legs and arms. Allow one hour for Lower and Upper Arterial scans. There are no restrictions or special instructions  Your physician has recommended you make the following change in your medication:   Decrease Toprol to 12. 5 mg by mouth daily. Decrease Atorvastatin to 20 mg by mouth daily.

## 2014-02-01 ENCOUNTER — Ambulatory Visit (HOSPITAL_COMMUNITY): Payer: BC Managed Care – PPO | Attending: Interventional Cardiology | Admitting: Cardiology

## 2014-02-01 DIAGNOSIS — J4489 Other specified chronic obstructive pulmonary disease: Secondary | ICD-10-CM | POA: Insufficient documentation

## 2014-02-01 DIAGNOSIS — J449 Chronic obstructive pulmonary disease, unspecified: Secondary | ICD-10-CM | POA: Diagnosis not present

## 2014-02-01 DIAGNOSIS — E785 Hyperlipidemia, unspecified: Secondary | ICD-10-CM | POA: Diagnosis not present

## 2014-02-01 DIAGNOSIS — F17200 Nicotine dependence, unspecified, uncomplicated: Secondary | ICD-10-CM | POA: Diagnosis not present

## 2014-02-01 DIAGNOSIS — I10 Essential (primary) hypertension: Secondary | ICD-10-CM | POA: Diagnosis not present

## 2014-02-01 DIAGNOSIS — I2510 Atherosclerotic heart disease of native coronary artery without angina pectoris: Secondary | ICD-10-CM | POA: Diagnosis not present

## 2014-02-01 DIAGNOSIS — I739 Peripheral vascular disease, unspecified: Secondary | ICD-10-CM

## 2014-02-01 NOTE — Progress Notes (Signed)
ABI performed  

## 2014-02-06 ENCOUNTER — Telehealth: Payer: Self-pay | Admitting: Cardiovascular Disease

## 2014-02-06 NOTE — Telephone Encounter (Signed)
New message      Talk to Dr Camillia Herter nurse

## 2014-02-06 NOTE — Telephone Encounter (Signed)
Spoke with pt and reviewed lower extremity arterial doppler results with him.

## 2014-03-13 ENCOUNTER — Telehealth: Payer: Self-pay | Admitting: Cardiovascular Disease

## 2014-03-13 NOTE — Telephone Encounter (Signed)
Records Rec Via Fax From [facility] to [facility] 11.2.15/km

## 2014-03-15 ENCOUNTER — Other Ambulatory Visit: Payer: Self-pay | Admitting: Cardiovascular Disease

## 2014-03-16 ENCOUNTER — Ambulatory Visit: Payer: Self-pay | Admitting: Family Medicine

## 2014-03-16 LAB — COMPREHENSIVE METABOLIC PANEL
ALBUMIN: 3.8 g/dL (ref 3.4–5.0)
ALT: 28 U/L
ANION GAP: 5 — AB (ref 7–16)
AST: 22 U/L (ref 15–37)
Alkaline Phosphatase: 105 U/L
BUN: 12 mg/dL (ref 7–18)
Bilirubin,Total: 0.6 mg/dL (ref 0.2–1.0)
CO2: 29 mmol/L (ref 21–32)
Calcium, Total: 8.6 mg/dL (ref 8.5–10.1)
Chloride: 105 mmol/L (ref 98–107)
Creatinine: 1.04 mg/dL (ref 0.60–1.30)
EGFR (Non-African Amer.): >60
Glucose: 86 mg/dL (ref 65–99)
OSMOLALITY: 277 (ref 275–301)
POTASSIUM: 4.3 mmol/L (ref 3.5–5.1)
Sodium: 139 mmol/L (ref 136–145)
Total Protein: 7.2 g/dL (ref 6.4–8.2)

## 2014-03-16 LAB — CBC WITH DIFFERENTIAL/PLATELET
Basophil #: 0 10*3/uL (ref 0.0–0.1)
Basophil %: 0.4 %
EOS ABS: 0.2 10*3/uL (ref 0.0–0.7)
Eosinophil %: 2.7 %
HCT: 47 % (ref 40.0–52.0)
HGB: 15.4 g/dL (ref 13.0–18.0)
Lymphocyte #: 2.6 10*3/uL (ref 1.0–3.6)
Lymphocyte %: 33.5 %
MCH: 30.1 pg (ref 26.0–34.0)
MCHC: 32.8 g/dL (ref 32.0–36.0)
MCV: 92 fL (ref 80–100)
MONO ABS: 0.6 x10 3/mm (ref 0.2–1.0)
Monocyte %: 7.3 %
Neutrophil #: 4.4 10*3/uL (ref 1.4–6.5)
Neutrophil %: 56.1 %
Platelet: 195 10*3/uL (ref 150–440)
RBC: 5.13 10*6/uL (ref 4.40–5.90)
RDW: 13.7 % (ref 11.5–14.5)
WBC: 7.9 10*3/uL (ref 3.8–10.6)

## 2014-03-16 LAB — LIPID PANEL
Cholesterol: 100 mg/dL (ref 0–200)
HDL Cholesterol: 31 mg/dL — ABNORMAL LOW (ref 40–60)
Ldl Cholesterol, Calc: 46 mg/dL (ref 0–100)
Triglycerides: 114 mg/dL (ref 0–200)
VLDL Cholesterol, Calc: 23 mg/dL (ref 5–40)

## 2014-03-16 LAB — TSH: THYROID STIMULATING HORM: 0.72 u[IU]/mL

## 2014-05-09 ENCOUNTER — Encounter: Payer: Self-pay | Admitting: Cardiovascular Disease

## 2014-05-09 ENCOUNTER — Ambulatory Visit (INDEPENDENT_AMBULATORY_CARE_PROVIDER_SITE_OTHER): Payer: BC Managed Care – PPO | Admitting: Cardiovascular Disease

## 2014-05-09 VITALS — BP 120/80 | HR 65 | Resp 12 | Ht 71.0 in | Wt 230.2 lb

## 2014-05-09 DIAGNOSIS — E785 Hyperlipidemia, unspecified: Secondary | ICD-10-CM

## 2014-05-09 DIAGNOSIS — I739 Peripheral vascular disease, unspecified: Secondary | ICD-10-CM

## 2014-05-09 DIAGNOSIS — I2510 Atherosclerotic heart disease of native coronary artery without angina pectoris: Secondary | ICD-10-CM

## 2014-05-09 DIAGNOSIS — I10 Essential (primary) hypertension: Secondary | ICD-10-CM

## 2014-05-09 DIAGNOSIS — Z720 Tobacco use: Secondary | ICD-10-CM

## 2014-05-09 NOTE — Patient Instructions (Signed)
Your physician wants you to follow-up in: 6 months.   You will receive a reminder letter in the mail two months in advance. If you don't receive a letter, please call our office to schedule the follow-up appointment.  Your physician has requested that you have a lexiscan myoview. For further information please visit [email or web address]. Please follow instruction sheet, as given.   

## 2014-05-09 NOTE — Progress Notes (Signed)
History of Present Illness: 58 yo male with history of CAD s/p PCI/stent followed by 4V CABG (LIMA to LAD, SVG to OM1, SVG to Ramus, SVG to PDA) October 2014 at [facility], PAD, HTN, COPD, tobacco abuse, HLD, sleep apnea here today for cardiac follow up. I saw him as a new patient September 2015 to establish cardiology care. He has been seen in our [place] office in April 2012 by Dr. Rockey Situ and has been followed over the last year by Dr. Saralyn Pilar in [place]. Admitted to [facility] October 2014 with chest pain and underwent 4V CABG. Also has COPD and OSA. He is known to have PAD with occluded right SFA. Lower extremity dopplers in our office 02/01/14 with normal ABI on the left and slight reduction of ABI (0.92) on the right with documentation of occluded right SFA.   He is here today for follow up. Overall feeling well. He has occasional exertion chest pains with associated SOB. pain, near syncope, syncope, dyspnea, LE edema.   Primary Care Physician: Dr. Miguel Aschoff  Last Lipid Profile: Followed in primary care.   Past Medical History  Diagnosis Date  . CAD (coronary artery disease)     s/p 4V CABG 2014  . COPD (chronic obstructive pulmonary disease)     ongoing tobacco use  . Hypertension     renal arteries patent on cath  . Anxiety and depression   . OSA (obstructive sleep apnea)     severe  . PAD (peripheral artery disease)     ABIs R 0.8 L 1.2 Total occlusion of right SFA with distal reconstitution   . Chronic prostatitis   . Hyperlipidemia   . Tobacco abuse     Past Surgical History  Procedure Laterality Date  . Coronary artery bypass graft  October 2014    4V  . Knee arthroscopy      right meniscal tear  . Cholecystectomy    . Cystoscopy      Current Outpatient Prescriptions  Medication Sig Dispense Refill  . ALPRAZolam (XANAX) 1 MG tablet Take 0.5 tablets (0.5 mg total) by mouth 2 (two) times daily. (Patient taking differently: Take 0.5 mg by mouth 3 (three) times  daily. ) 60 tablet 2  . aspirin 81 MG EC tablet Take 81 mg by mouth daily.      . clopidogrel (PLAVIX) 75 MG tablet TAKE 1 TABLET BY MOUTH ONCE DAILY. 30 tablet 3  . esomeprazole (NEXIUM) 40 MG capsule Take by mouth.    Marland Kitchen lisinopril (PRINIVIL,ZESTRIL) 5 MG tablet TAKE 1 TABLET EVERY DAY 30 tablet 0  . metoprolol succinate (TOPROL XL) 25 MG 24 hr tablet Take 0.5 tablets (12.5 mg total) by mouth daily. 15 tablet 11  . pentosan polysulfate (ELMIRON) 100 MG capsule Take 200 mg by mouth daily.      Marland Kitchen terazosin (HYTRIN) 10 MG capsule Take 10 mg by mouth at bedtime.      Marland Kitchen atorvastatin (LIPITOR) 40 MG tablet   6  . sertraline (ZOLOFT) 100 MG tablet   4   No current facility-administered medications for this visit.    Allergies  Allergen Reactions  . Relafen [Nabumetone]     History   Social History  . Marital Status: Married    Spouse Name: N/A    Number of Children: N/A  . Years of Education: N/A   Occupational History  . Not on file.   Social History Main Topics  . Smoking status: Current Every Day Smoker --  1.00 packs/day for 40 years    Types: Cigarettes  . Smokeless tobacco: Never Used     Comment: only smoking three per day  . Alcohol Use: No  . Drug Use: No  . Sexual Activity: Not on file   Other Topics Concern  . Not on file   Social History Narrative    Family History  Problem Relation Age of Onset  . Hypertension Mother   . Cataracts Mother   . Diabetes Father     Review of Systems:  As stated in the HPI and otherwise negative.   BP 120/80 mmHg  Pulse 65  Resp 12  Ht 5\' 11"  (1.803 m)  Wt 230 lb 3.2 oz (104.418 kg)  BMI 32.12 kg/m2  Physical Examination: General: Well developed, well nourished, NAD HEENT: OP clear, mucus membranes moist SKIN: warm, dry. No rashes. Neuro: No focal deficits Musculoskeletal: Muscle strength 5/5 all ext Psychiatric: Mood and affect normal Neck: No JVD, no carotid bruits, no thyromegaly, no  lymphadenopathy. Lungs:Clear bilaterally, no wheezes, rhonci, crackles Cardiovascular: Regular rate and rhythm. No murmurs, gallops or rubs. Abdomen:Soft. Bowel sounds present. Non-tender.  Extremities: No lower extremity edema. Pulses are 2 + in the bilateral DP/PT.  Assessment and Plan:   1. CAD: s/p 4V CABG in October 2014 at [facility]. Recent chest pains concerning for angina. Will arrange Lexiscan stress myoview to exclude ischemia. He cannot walk on a treadmill due to foot pain. He is on good medical therapy. He is on a ASA, Plavix, statin, Lopressor and Ace-inh. He can hold his Plavix if needed for dental procedures.   2. HTN: BP controlled. No changes.   3. HLD: Continue statin. Lipids followed in primary care office.   4. Tobacco abuse: Smoking cessation recommended. He is trying to stop  5. PAD: Known to have occluded right SFA. ABI 02/01/14 slightly decreased on right, normal on left.  He has minimal pain with walking.

## 2014-05-19 ENCOUNTER — Observation Stay (HOSPITAL_COMMUNITY)
Admission: EM | Admit: 2014-05-19 | Discharge: 2014-05-20 | Disposition: A | Discharge status: Home / Self Care | Payer: BLUE CROSS/BLUE SHIELD | Attending: Cardiology | Admitting: Cardiology

## 2014-05-19 ENCOUNTER — Emergency Department (HOSPITAL_COMMUNITY): Payer: BLUE CROSS/BLUE SHIELD

## 2014-05-19 ENCOUNTER — Telehealth: Payer: Self-pay | Admitting: Cardiovascular Disease

## 2014-05-19 DIAGNOSIS — J449 Chronic obstructive pulmonary disease, unspecified: Secondary | ICD-10-CM | POA: Insufficient documentation

## 2014-05-19 DIAGNOSIS — Z7982 Long term (current) use of aspirin: Secondary | ICD-10-CM | POA: Insufficient documentation

## 2014-05-19 DIAGNOSIS — R0789 Other chest pain: Secondary | ICD-10-CM

## 2014-05-19 DIAGNOSIS — R197 Diarrhea, unspecified: Secondary | ICD-10-CM | POA: Diagnosis not present

## 2014-05-19 DIAGNOSIS — G4733 Obstructive sleep apnea (adult) (pediatric): Secondary | ICD-10-CM | POA: Diagnosis present

## 2014-05-19 DIAGNOSIS — R079 Chest pain, unspecified: Secondary | ICD-10-CM | POA: Diagnosis not present

## 2014-05-19 DIAGNOSIS — E662 Morbid (severe) obesity with alveolar hypoventilation: Secondary | ICD-10-CM | POA: Diagnosis present

## 2014-05-19 DIAGNOSIS — E669 Obesity, unspecified: Secondary | ICD-10-CM | POA: Diagnosis not present

## 2014-05-19 DIAGNOSIS — Z79899 Other long term (current) drug therapy: Secondary | ICD-10-CM | POA: Diagnosis not present

## 2014-05-19 DIAGNOSIS — R5383 Other fatigue: Secondary | ICD-10-CM | POA: Diagnosis not present

## 2014-05-19 DIAGNOSIS — E785 Hyperlipidemia, unspecified: Secondary | ICD-10-CM | POA: Diagnosis not present

## 2014-05-19 DIAGNOSIS — Z7902 Long term (current) use of antithrombotics/antiplatelets: Secondary | ICD-10-CM | POA: Insufficient documentation

## 2014-05-19 DIAGNOSIS — Z6834 Body mass index (BMI) 34.0-34.9, adult: Secondary | ICD-10-CM | POA: Diagnosis present

## 2014-05-19 DIAGNOSIS — I10 Essential (primary) hypertension: Secondary | ICD-10-CM | POA: Diagnosis present

## 2014-05-19 DIAGNOSIS — Z720 Tobacco use: Secondary | ICD-10-CM | POA: Diagnosis not present

## 2014-05-19 DIAGNOSIS — F418 Other specified anxiety disorders: Secondary | ICD-10-CM | POA: Insufficient documentation

## 2014-05-19 DIAGNOSIS — N411 Chronic prostatitis: Secondary | ICD-10-CM | POA: Insufficient documentation

## 2014-05-19 DIAGNOSIS — I2510 Atherosclerotic heart disease of native coronary artery without angina pectoris: Secondary | ICD-10-CM

## 2014-05-19 DIAGNOSIS — Z951 Presence of aortocoronary bypass graft: Secondary | ICD-10-CM | POA: Diagnosis not present

## 2014-05-19 DIAGNOSIS — F419 Anxiety disorder, unspecified: Secondary | ICD-10-CM | POA: Diagnosis present

## 2014-05-19 DIAGNOSIS — Z9981 Dependence on supplemental oxygen: Secondary | ICD-10-CM | POA: Diagnosis not present

## 2014-05-19 DIAGNOSIS — I739 Peripheral vascular disease, unspecified: Secondary | ICD-10-CM | POA: Diagnosis present

## 2014-05-19 HISTORY — DX: Obesity, unspecified: E66.9

## 2014-05-19 LAB — BASIC METABOLIC PANEL
ANION GAP: 14 (ref 5–15)
BUN: 12 mg/dL (ref 6–23)
CALCIUM: 9.5 mg/dL (ref 8.4–10.5)
CO2: 22 mmol/L (ref 19–32)
CREATININE: 1.14 mg/dL (ref 0.50–1.35)
Chloride: 104 mEq/L (ref 96–112)
GFR calc non Af Amer: 70 mL/min — ABNORMAL LOW (ref >90)
GFR, EST AFRICAN AMERICAN: 81 mL/min — AB (ref >90)
GLUCOSE: 97 mg/dL (ref 70–99)
Potassium: 4.5 mmol/L (ref 3.5–5.1)
SODIUM: 140 mmol/L (ref 135–145)

## 2014-05-19 LAB — CBC
HEMATOCRIT: 48.6 % (ref 39.0–52.0)
HEMATOCRIT: 45.2 % (ref 39.0–52.0)
HEMOGLOBIN: 16.7 g/dL (ref 13.0–17.0)
Hemoglobin: 15.9 g/dL (ref 13.0–17.0)
MCH: 29.8 pg (ref 26.0–34.0)
MCH: 30.3 pg (ref 26.0–34.0)
MCHC: 34.4 g/dL (ref 30.0–36.0)
MCHC: 35.2 g/dL (ref 30.0–36.0)
MCV: 86.6 fL (ref 78.0–100.0)
MCV: 86.3 fL (ref 78.0–100.0)
Platelets: 206 10*3/uL (ref 150–400)
Platelets: 188 10*3/uL (ref 150–400)
RBC: 5.61 MIL/uL (ref 4.22–5.81)
RBC: 5.24 MIL/uL (ref 4.22–5.81)
RDW: 12.7 % (ref 11.5–15.5)
RDW: 12.6 % (ref 11.5–15.5)
WBC: 8.2 10*3/uL (ref 4.0–10.5)
WBC: 7.3 10*3/uL (ref 4.0–10.5)

## 2014-05-19 LAB — TROPONIN I: Troponin I: <0.03 ng/mL (ref <0.031)

## 2014-05-19 LAB — I-STAT TROPONIN, ED: Troponin i, poc: 0 ng/mL (ref 0.00–0.08)

## 2014-05-19 LAB — CREATININE, SERUM
Creatinine, Ser: 1.07 mg/dL (ref 0.50–1.35)
GFR calc Af Amer: 88 mL/min — ABNORMAL LOW (ref >90)
GFR calc non Af Amer: 76 mL/min — ABNORMAL LOW (ref >90)

## 2014-05-19 LAB — BRAIN NATRIURETIC PEPTIDE: B Natriuretic Peptide: 25.4 pg/mL (ref 0.0–100.0)

## 2014-05-19 MED ORDER — ACETAMINOPHEN 325 MG PO TABS: 650.0000 mg | ORAL_TABLET | ORAL | Status: DC | PRN

## 2014-05-19 MED ORDER — ATORVASTATIN CALCIUM 40 MG PO TABS: 40.0000 mg | ORAL_TABLET | Freq: Every day | ORAL | Status: DC

## 2014-05-19 MED ORDER — SERTRALINE HCL 100 MG PO TABS
100.0000 mg | ORAL_TABLET | Freq: Every day | ORAL | Status: DC
  Administered 2014-05-20: 100 mg via ORAL
  Filled 2014-05-19: qty 1

## 2014-05-19 MED ORDER — LISINOPRIL 5 MG PO TABS
5.0000 mg | ORAL_TABLET | Freq: Every day | ORAL | Status: DC
  Administered 2014-05-20: 5 mg via ORAL
  Filled 2014-05-19: qty 1

## 2014-05-19 MED ORDER — PANTOPRAZOLE SODIUM 40 MG PO TBEC
40.0000 mg | DELAYED_RELEASE_TABLET | Freq: Every day | ORAL | Status: DC
  Administered 2014-05-20: 40 mg via ORAL
  Filled 2014-05-19: qty 1

## 2014-05-19 MED ORDER — ONDANSETRON HCL 4 MG/2ML IJ SOLN: 4.0000 mg | Freq: Four times a day (QID) | INTRAMUSCULAR | Status: DC | PRN

## 2014-05-19 MED ORDER — ASPIRIN EC 81 MG PO TBEC
81.0000 mg | DELAYED_RELEASE_TABLET | Freq: Every day | ORAL | Status: DC
  Administered 2014-05-20: 81 mg via ORAL
  Filled 2014-05-19 (×2): qty 1

## 2014-05-19 MED ORDER — ALPRAZOLAM 0.5 MG PO TABS
0.5000 mg | ORAL_TABLET | Freq: Two times a day (BID) | ORAL | Status: DC
  Administered 2014-05-19 – 2014-05-20 (×2): 0.5 mg via ORAL
  Filled 2014-05-19 (×2): qty 1

## 2014-05-19 MED ORDER — HEPARIN SODIUM (PORCINE) 5000 UNIT/ML IJ SOLN
5000.0000 [IU] | Freq: Three times a day (TID) | INTRAMUSCULAR | Status: DC
  Filled 2014-05-19: qty 1

## 2014-05-19 MED ORDER — METOPROLOL SUCCINATE ER 25 MG PO TB24
12.5000 mg | ORAL_TABLET | Freq: Every day | ORAL | Status: DC
  Administered 2014-05-20: 12.5 mg via ORAL
  Filled 2014-05-19: qty 1

## 2014-05-19 MED ORDER — CLOPIDOGREL BISULFATE 75 MG PO TABS
75.0000 mg | ORAL_TABLET | Freq: Every day | ORAL | Status: DC
  Administered 2014-05-20: 75 mg via ORAL
  Filled 2014-05-19: qty 1

## 2014-05-19 MED ORDER — ATORVASTATIN CALCIUM 40 MG PO TABS
40.0000 mg | ORAL_TABLET | Freq: Every day | ORAL | Status: DC
  Administered 2014-05-19: 40 mg via ORAL
  Filled 2014-05-19: qty 1

## 2014-05-19 NOTE — ED Provider Notes (Signed)
CSN: [id number]     Arrival date & time 05/19/14  1438 History   First MD Initiated Contact with Patient 05/19/14 1718     Chief Complaint  Patient presents with  . Chest Pain     (Consider location/radiation/quality/duration/timing/severity/associated sxs/prior Treatment) Patient is a 59 y.o. male presenting with chest pain. The history is provided by the patient and the spouse.  Chest Pain Pain location:  Substernal area Pain quality comment:  "stinging" Pain radiates to:  Does not radiate Pain radiates to the back: no   Pain severity:  Moderate Onset quality:  Sudden Duration: lasts several minutes per episode. Timing:  Intermittent Progression:  Waxing and waning Chronicity:  New Context: at rest   Relieved by:  Nothing Exacerbated by: stress. Ineffective treatments:  None tried Associated symptoms: anxiety, cough, dizziness, fatigue and palpitations   Associated symptoms: no abdominal pain, no back pain, no diaphoresis, no dysphagia, no fever, no headache, no lower extremity edema, no nausea, no shortness of breath, not vomiting and no weakness   Risk factors: coronary artery disease     Past Medical History  Diagnosis Date  . CAD (coronary artery disease)     s/p 4V CABG 2014  . COPD (chronic obstructive pulmonary disease)     ongoing tobacco use  . Hypertension     renal arteries patent on cath  . Anxiety and depression   . OSA (obstructive sleep apnea)     severe  . PAD (peripheral artery disease)     ABIs R 0.8 L 1.2 Total occlusion of right SFA with distal reconstitution   . Chronic prostatitis   . Hyperlipidemia   . Tobacco abuse    Past Surgical History  Procedure Laterality Date  . Coronary artery bypass graft  October 2014    4V  . Knee arthroscopy      right meniscal tear  . Cholecystectomy    . Cystoscopy     Family History  Problem Relation Age of Onset  . Hypertension Mother   . Cataracts Mother   . Diabetes Father    History  Substance  Use Topics  . Smoking status: Current Every Day Smoker -- 1.00 packs/day for 40 years    Types: Cigarettes  . Smokeless tobacco: Never Used     Comment: only smoking three per day  . Alcohol Use: No    Review of Systems  Constitutional: Positive for fatigue. Negative for fever, diaphoresis, activity change and appetite change.  HENT: Negative for facial swelling, sore throat, tinnitus, trouble swallowing and voice change.   Eyes: Negative for pain, redness and visual disturbance.  Respiratory: Positive for cough. Negative for chest tightness, shortness of breath and wheezing.   Cardiovascular: Positive for chest pain and palpitations. Negative for leg swelling.  Gastrointestinal: Positive for diarrhea. Negative for nausea, vomiting, abdominal pain, constipation, blood in stool, abdominal distention, anal bleeding and rectal pain.  Endocrine: Negative.   Genitourinary: Negative.  Negative for dysuria, decreased urine volume, scrotal swelling and testicular pain.  Musculoskeletal: Negative for myalgias, back pain and gait problem.  Skin: Negative.  Negative for rash.  Neurological: Positive for dizziness. Negative for tremors, weakness and headaches.  Psychiatric/Behavioral: Negative for suicidal ideas, hallucinations and self-injury. The patient is not nervous/anxious.       Allergies  Relafen and Sulfa antibiotics  Home Medications   Prior to Admission medications   Medication Sig Start Date End Date Taking? Authorizing Provider  ALPRAZolam Duanne Moron) 1 MG tablet Take 0.5  tablets (0.5 mg total) by mouth 2 (two) times daily. Patient taking differently: Take 0.5 mg by mouth 3 (three) times daily.  11/08/10   Tonia Ghent, MD  aspirin 81 MG EC tablet Take 81 mg by mouth daily.      Historical Provider, MD  atorvastatin (LIPITOR) 40 MG tablet  04/12/14   Historical Provider, MD  clopidogrel (PLAVIX) 75 MG tablet TAKE 1 TABLET BY MOUTH ONCE DAILY. 03/16/14   Burnell Blanks, MD   esomeprazole (NEXIUM) 40 MG capsule Take by mouth.    Historical Provider, MD  lisinopril (PRINIVIL,ZESTRIL) 5 MG tablet TAKE 1 TABLET EVERY DAY 09/24/10   Modesto Charon, MD  metoprolol succinate (TOPROL XL) 25 MG 24 hr tablet Take 0.5 tablets (12.5 mg total) by mouth daily. 01/30/14   Burnell Blanks, MD  pentosan polysulfate (ELMIRON) 100 MG capsule Take 200 mg by mouth daily.      Historical Provider, MD  sertraline (ZOLOFT) 100 MG tablet  05/08/14   Historical Provider, MD  terazosin (HYTRIN) 10 MG capsule Take 10 mg by mouth at bedtime.      Historical Provider, MD   BP 133/80 mmHg  Pulse 55  Temp(Src) 99.9 F (37.7 C) (Oral)  Resp 11  Ht 5\' 11"  (1.803 m)  Wt 222 lb (100.699 kg)  BMI 30.98 kg/m2  SpO2 97% Physical Exam  Constitutional: He is oriented to person, place, and time. He appears well-developed and well-nourished. No distress.  HENT:  Head: Normocephalic and atraumatic.  Right Ear: External ear normal.  Left Ear: External ear normal.  Nose: Nose normal.  Mouth/Throat: Oropharynx is clear and moist.  Eyes: Conjunctivae and EOM are normal. Pupils are equal, round, and reactive to light. No scleral icterus.  Neck: Normal range of motion. Neck supple. No JVD present. No tracheal deviation present. No thyromegaly present.  Cardiovascular: Normal rate and intact distal pulses.  Exam reveals no gallop and no friction rub.   No murmur heard. Pulmonary/Chest: Effort normal and breath sounds normal. No stridor. No respiratory distress. He has no wheezes. He has no rales.  Abdominal: Soft. He exhibits no distension. There is no tenderness. There is no rebound and no guarding.  Musculoskeletal: Normal range of motion. He exhibits no edema or tenderness.  Neurological: He is alert and oriented to person, place, and time. No cranial nerve deficit. He exhibits normal muscle tone. Coordination normal.  5/5 strength in all 4 extremities. Normal Gait.   Skin: Skin is warm and  dry. No rash noted. He is not diaphoretic.  Psychiatric: He has a normal mood and affect. His behavior is normal.  Nursing note and vitals reviewed.   ED Course  Procedures (including critical care time) Labs Review Labs Reviewed  BASIC METABOLIC PANEL - Abnormal; Notable for the following:    GFR calc non Af Amer 70 (*)    GFR calc Af Amer 81 (*)    All other components within normal limits  CBC  BRAIN NATRIURETIC PEPTIDE  TROPONIN I  TROPONIN I  TROPONIN I  CBC  CREATININE, SERUM  I-STAT TROPOININ, ED    Imaging Review Dg Chest 2 View  05/19/2014   CLINICAL DATA:  Chest pain.  Feeling anxious.  EXAM: CHEST  2 VIEW  COMPARISON:  None.  FINDINGS: The heart size and mediastinal contours are within normal limits. Prior CABG. Both lungs are clear. Degenerative changes of bilateral acromioclavicular joints.  IMPRESSION: No active cardiopulmonary disease.   Electronically Signed  By: Kathreen Devoid   On: 05/19/2014 18:41     EKG Interpretation   Date/Time:  Friday May 19 2014 14:59:32 EST Ventricular Rate:  85 PR Interval:  172 QRS Duration: 98 QT Interval:  370 QTC Calculation: 440 R Axis:   -94 Text Interpretation:  Normal sinus rhythm IV conduction delay Slow R  progression Inferior infarct , age undetermined Abnormal ECG Confirmed by  Jeneen Rinks  MD, [place] ([id number]) on 05/19/2014 5:17:11 PM      MDM   Final diagnoses:  Chest pain    The patient is a 59 year old male with history of coronary artery disease status post PCI in 2010 and 4 vessel CABG in October 2014 he presents for 5 days of intermittent atypical chest pain. Patient is a patient of Dr. Versie Starks and is scheduled to have a Lexiscan next week. EKG significant for inverted T waves in aVL original troponin is undetectable. Cardiology's consult and the case is discussed with him. After evaluation in the ED cardiology admit the patient for service for further management.  Patient seen with attending, Dr. Jeneen Rinks, who  oversaw clinical decision making.   Margaretann Loveless, MD 05/19/14 1925  Tanna Furry, MD 05/30/14 [phone]

## 2014-05-19 NOTE — ED Notes (Signed)
Transporting patient to new room assignment. 

## 2014-05-19 NOTE — Telephone Encounter (Signed)
Spoke with pt's wife. She is not currently with pt. He is at work. Wife reports pt has been feeling bad for last couple of days. Did not go to work until late this AM due to feeling poorly. She reports he has chronic chest pain but it has increased since office visit with Dr. Angelena Form at end of December. Is scheduled for Lexiscan next week.  Wife reports pt states his heart has been racing today. She does not know if he is currently having chest pain but did have chest pain earlier today.  I instructed wife pt needs to go to ED for evaluation.  He should call 911 if having active chest pain or feeling poorly.  Wife will contact pt with these instructions.

## 2014-05-19 NOTE — ED Notes (Addendum)
Pt reports while at work today he felt his heart beat fast and reports feeling anxious. Pt report having a lot of stress and he recently stopped smoking . Pt states his Hr was above 100. Pt also reports feeling like he has a fever.

## 2014-05-19 NOTE — Consult Note (Signed)
Primary cardiologist: Dr Angelena Form Consulting cardiologist: Dr Carlyle Dolly  Clinical Summary [name] is a 59 y.o.male hx of CAD w/ history of prior PCI and prior 4 vessel CABG4V CABG (LIMA to LAD, SVG to OM1, SVG to Ramus, SVG to PDA) October 2014 at [facility], PAD, HTN, COPD, tobacco abuse, HL, OSA presents with chest pain. Seen by Dr Angelena Form 05/09/14 for chest pain, outpatient stress test was ordered.  Reports a several week history of intermittent chest pain. Dull pain that can be left or right sided, 2-3/10 in severity. No other associated symptoms. Lasts for just a few minutes. Occurs approx 2 times a day. Different from prior pain he has had at his prior CABG site. Has had some DOE as well.   Cr 1.14, BUN 12, K 4.5, BNP 25, Hgb 16.7 Plt 206.  CXR no acute process EKG SR, inferior Q waves, no acute changes.   Allergies  Allergen Reactions  . Relafen [Nabumetone]   . Sulfa Antibiotics Diarrhea    Medications Scheduled Medications:    Infusions:    PRN Medications:        Past Medical History  Diagnosis Date  . CAD (coronary artery disease)     s/p 4V CABG 2014  . COPD (chronic obstructive pulmonary disease)     ongoing tobacco use  . Hypertension     renal arteries patent on cath  . Anxiety and depression   . OSA (obstructive sleep apnea)     severe  . PAD (peripheral artery disease)     ABIs R 0.8 L 1.2 Total occlusion of right SFA with distal reconstitution   . Chronic prostatitis   . Hyperlipidemia   . Tobacco abuse     Past Surgical History  Procedure Laterality Date  . Coronary artery bypass graft  October 2014    4V  . Knee arthroscopy      right meniscal tear  . Cholecystectomy    . Cystoscopy      Family History  Problem Relation Age of Onset  . Hypertension Mother   . Cataracts Mother   . Diabetes Father     Social History [name] reports that he has been smoking Cigarettes.  He has a 40 pack-year smoking history.  He has never used smokeless tobacco. [name] reports that he does not drink alcohol.  Review of Systems CONSTITUTIONAL: No weight loss, fever, chills, weakness or fatigue.  HEENT: Eyes: No visual loss, blurred vision, double vision or yellow sclerae. No hearing loss, sneezing, congestion, runny nose or sore throat.  SKIN: No rash or itching.  CARDIOVASCULAR: per HPI  RESPIRATORY: No shortness of breath, cough or sputum.  GASTROINTESTINAL: No anorexia, nausea, vomiting or diarrhea. No abdominal pain or blood.  GENITOURINARY: no polyuria, no dysuria NEUROLOGICAL: No headache, dizziness, syncope, paralysis, ataxia, numbness or tingling in the extremities. No change in bowel or bladder control.  MUSCULOSKELETAL: No muscle, back pain, joint pain or stiffness.  HEMATOLOGIC: No anemia, bleeding or bruising.  LYMPHATICS: No enlarged nodes. No history of splenectomy.  PSYCHIATRIC: No history of depression or anxiety.      Physical Examination Blood pressure 123/71, pulse 58, temperature 99.9 F (37.7 C), temperature source Oral, resp. rate 20, height 5\' 11"  (1.803 m), weight 222 lb (100.699 kg), SpO2 97 %. No intake or output data in the 24 hours ending 05/19/14 1845  HEENT: sclera clear  Cardiovascular: RRR, no m/r/g, no JVD, no carotid bruits  Respiratory: CTAB  GI:  abdomen soft, NT, ND  MSK: no LE edema  Neuro: no focal deficitis  Psych: appropriate affect   Lab Results  Basic Metabolic Panel:  Recent Labs Lab 05/19/14 1518  NA 140  K 4.5  CL 104  CO2 22  GLUCOSE 97  BUN 12  CREATININE 1.14  CALCIUM 9.5    Liver Function Tests: No results for input(s): AST, ALT, ALKPHOS, BILITOT, PROT, ALBUMIN in the last 168 hours.  CBC:  Recent Labs Lab 05/19/14 1518  WBC 8.2  HGB 16.7  HCT 48.6  MCV 86.6  PLT 206    Cardiac Enzymes: No results for input(s): CKTOTAL, CKMB, CKMBINDEX, TROPONINI in the last 168 hours.  BNP: Invalid input(s):  POCBNP     Impression/Recommendations 1. Chest pain - somewhat atypical symptoms, he does have a known history of CAD with prior revascularization - will admit for 24 hr obs, change his previously scheduled outpatient lexiscan to inpatient study for tomorrow.     Carlyle Dolly, M.D..

## 2014-05-19 NOTE — Plan of Care (Signed)
Problem: Consults Goal: Tobacco Cessation referral if indicated Outcome: Not Applicable Date Met:  23/95/32 Patient states he quit tobacco use 2-3 days ago.

## 2014-05-19 NOTE — Telephone Encounter (Signed)
New Msg      Pt c/o of Chest Pain: STAT if CP now or developed within 24 hours  1. Are you having CP right now? NO   2. Are you experiencing any other symptoms (ex. SOB, nausea, vomiting, sweating)? NO   3. How long have you been experiencing CP? Off and on for a few days  4. Is your CP continuous or coming and going? Coming and going  5. Have you taken Nitroglycerin? No pt doesn't have any   Pt HR is between 88-100.  Please call wife at [phone].  ?

## 2014-05-20 ENCOUNTER — Observation Stay (HOSPITAL_COMMUNITY): Payer: BLUE CROSS/BLUE SHIELD

## 2014-05-20 ENCOUNTER — Encounter (HOSPITAL_COMMUNITY): Payer: Self-pay | Admitting: Physician Assistant

## 2014-05-20 DIAGNOSIS — Z720 Tobacco use: Secondary | ICD-10-CM | POA: Diagnosis present

## 2014-05-20 DIAGNOSIS — I2510 Atherosclerotic heart disease of native coronary artery without angina pectoris: Secondary | ICD-10-CM | POA: Diagnosis not present

## 2014-05-20 DIAGNOSIS — R197 Diarrhea, unspecified: Secondary | ICD-10-CM

## 2014-05-20 DIAGNOSIS — E669 Obesity, unspecified: Secondary | ICD-10-CM | POA: Diagnosis present

## 2014-05-20 DIAGNOSIS — R5383 Other fatigue: Secondary | ICD-10-CM | POA: Diagnosis not present

## 2014-05-20 DIAGNOSIS — R079 Chest pain, unspecified: Secondary | ICD-10-CM | POA: Diagnosis not present

## 2014-05-20 DIAGNOSIS — I739 Peripheral vascular disease, unspecified: Secondary | ICD-10-CM | POA: Diagnosis present

## 2014-05-20 DIAGNOSIS — E662 Morbid (severe) obesity with alveolar hypoventilation: Secondary | ICD-10-CM | POA: Diagnosis present

## 2014-05-20 DIAGNOSIS — R072 Precordial pain: Secondary | ICD-10-CM

## 2014-05-20 LAB — TROPONIN I

## 2014-05-20 MED ORDER — TECHNETIUM TC 99M SESTAMIBI GENERIC - CARDIOLITE
30.0000 | Freq: Once | INTRAVENOUS | Status: AC | PRN
  Administered 2014-05-20: 30 via INTRAVENOUS

## 2014-05-20 MED ORDER — TECHNETIUM TC 99M SESTAMIBI GENERIC - CARDIOLITE
10.0000 | Freq: Once | INTRAVENOUS | Status: AC | PRN
  Administered 2014-05-20: 10 via INTRAVENOUS

## 2014-05-20 MED ORDER — NITROGLYCERIN 0.4 MG SL SUBL: 0.4000 mg | SUBLINGUAL_TABLET | SUBLINGUAL | Status: DC | PRN

## 2014-05-20 MED ORDER — REGADENOSON 0.4 MG/5ML IV SOLN
INTRAVENOUS | Status: AC
  Administered 2014-05-20: 0.4 mg
  Filled 2014-05-20: qty 5

## 2014-05-20 NOTE — Discharge Summary (Signed)
Discharge Summary   Patient ID: Richard Burton MRN: [id number], DOB/AGE: 1955-12-30 59 y.o. Admit date: 05/19/2014 D/C date:     05/20/2014  Primary Cardiologist: Dr. Julianne Handler  Principal Problem:   Chest pain Active Problems:   HLD (hyperlipidemia)   Anxiety   OSA (obstructive sleep apnea)   HYPERTENSION, BENIGN   CAD (coronary artery disease)   Tobacco abuse   PAD (peripheral artery disease)   Obesity     Admission Dates: 05/19/14 - 05/20/14 Discharge Diagnosis: chest pain s/p low risk nuclear stress test  HPI: Richard Burton is a 59 y.o. male with a history of CAD w/ prior PCI and CABG4V 02/2013 at [facility], PAD, HTN, COPD, tobacco abuse, HLD, and OSA who presented to [facility] on 05/19/14 with chest pain.   He was recently seen by Dr Angelena Form 05/09/14 for chest pain and an outpatient stress test was ordered. He reported a several week history of intermittent chest pain. Dull pain that can be left or right sided, 2-3/10 in severity. No other associated symptoms. Lasts for just a few minutes. Occurs approx 2 times a day. Different from prior pain he has had at his prior CABG site. Has had some DOE as well. He is worried and thinks it may be anxiety but would like to rule out a cardiac cause. He also notes some fevers and chills and GI upset for the past couple days.   Hospital Course  Chest pain- somewhat atypical symptoms, he does have a known history of CAD with prior revascularization -- Troponin neg x2.  -- Leane Call returned low risk with no ischemia EF 69%. He will be discharged home with same medications. I have added SL  NTG as well as he is worried about future chest pain.  Diarrhea- watery and smells foul -- Will do Cdif PCR- still pending.  -- Mild temp 99.9 feg F -- Encouraged rest and fluids.   The patient has had an uncomplicated hospital course and is recovering well. He has been seen by Dr. Meda Coffee today and deemed ready for discharge home. All follow-up  appointments have been scheduled. Discharge medications are listed below.   Discharge Vitals: Blood pressure 141/72, pulse 57, temperature 99.2 F (37.3 C), temperature source Oral, resp. rate 14, height 5\' 11"  (1.803 m), weight 214 lb 15.2 oz (97.5 kg), SpO2 97 %.  Labs: Lab Results  Component Value Date   WBC 7.3 05/19/2014   HGB 15.9 05/19/2014   HCT 45.2 05/19/2014   MCV 86.3 05/19/2014   PLT 188 05/19/2014     Recent Labs Lab 05/19/14 1518 05/19/14 1925  NA 140  --   K 4.5  --   CL 104  --   CO2 22  --   BUN 12  --   CREATININE 1.14 1.07  CALCIUM 9.5  --   GLUCOSE 97  --     Recent Labs  05/19/14 1925 05/20/14 0110  TROPONINI <0.03 <0.03      Diagnostic Studies/Procedures   Dg Chest 2 View  05/19/2014   CLINICAL DATA:  Chest pain.  Feeling anxious.  EXAM: CHEST  2 VIEW  COMPARISON:  None.  FINDINGS: The heart size and mediastinal contours are within normal limits. Prior CABG. Both lungs are clear. Degenerative changes of bilateral acromioclavicular joints.  IMPRESSION: No active cardiopulmonary disease.   Electronically Signed   By: Kathreen Devoid   On: 05/19/2014 18:41   Nm Myocar Multi W/spect W/wall Motion / Leory Plowman  05/20/2014   CLINICAL DATA:  Chest pain, CT  EXAM: MYOCARDIAL IMAGING WITH SPECT (REST AND PHARMACOLOGIC-STRESS)  GATED LEFT VENTRICULAR WALL MOTION STUDY  LEFT VENTRICULAR EJECTION FRACTION  TECHNIQUE: Standard myocardial SPECT imaging was performed after resting intravenous injection of 10 mCi Tc-54m sestamibi. Subsequently, intravenous infusion of Lexiscan was performed under the supervision of the Cardiology staff. At peak effect of the drug, 30 mCi Tc-84m sestamibi was injected intravenously and standard myocardial SPECT imaging was performed. Quantitative gated imaging was also performed to evaluate left ventricular wall motion, and estimate left ventricular ejection fraction.  COMPARISON:  None.  FINDINGS: Perfusion: No decreased activity in the left  ventricle on stress imaging to suggest reversible ischemia or infarction.  Wall Motion: Normal left ventricular wall motion. No left ventricular dilation.  Left Ventricular Ejection Fraction: 69 %  End diastolic volume 77 ml  End systolic volume 24 ml  IMPRESSION: 1. No reversible ischemia or infarction.  2. Normal left ventricular wall motion.  3. Left ventricular ejection fraction 69%  4. Low-risk stress test findings*.  *2012 Appropriate Use Criteria for Coronary Revascularization Focused Update: J Am Coll Cardiol. 8315;17(6):160-737. http://content.airportbarriers.com.aspx?articleid=1201161   Electronically Signed   By: Julian Hy M.D.   On: 05/20/2014 11:34    Discharge Medications     Medication List    TAKE these medications        ALPRAZolam 1 MG tablet  Commonly known as:  XANAX  Take 0.5 tablets (0.5 mg total) by mouth 2 (two) times daily.     aspirin 81 MG EC tablet  Take 81 mg by mouth daily.     atorvastatin 40 MG tablet  Commonly known as:  LIPITOR  Take 40 mg by mouth daily at 6 PM.     clopidogrel 75 MG tablet  Commonly known as:  PLAVIX  TAKE 1 TABLET BY MOUTH ONCE DAILY.     esomeprazole 40 MG capsule  Commonly known as:  NEXIUM  Take 40 mg by mouth 2 (two) times daily before a meal.     lisinopril 5 MG tablet  Commonly known as:  PRINIVIL,ZESTRIL  TAKE 1 TABLET EVERY DAY     metoprolol succinate 25 MG 24 hr tablet  Commonly known as:  TOPROL XL  Take 0.5 tablets (12.5 mg total) by mouth daily.     nitroGLYCERIN 0.4 MG SL tablet  Commonly known as:  NITROSTAT  Place 1 tablet (0.4 mg total) under the tongue every 5 (five) minutes as needed for chest pain.     pentosan polysulfate 100 MG capsule  Commonly known as:  ELMIRON  Take 200 mg by mouth 2 (two) times daily.     sertraline 100 MG tablet  Commonly known as:  ZOLOFT  Take 100 mg by mouth daily.     terazosin 10 MG capsule  Commonly known as:  HYTRIN  Take 10 mg by mouth daily.          Disposition   The patient will be discharged in stable condition to home.  Follow-up Information    Follow up with Lauree Chandler, MD.   Specialty:  Cardiology   Why:  The office will call you to make an appoinment.   Contact information:   [address] [address] [phone]         Duration of Discharge Encounter: Greater than 30 minutes including physician and PA time.  Mable Fill R PA-C 05/20/2014, 6:16 PM

## 2014-05-20 NOTE — Progress Notes (Signed)
UR completed 

## 2014-05-20 NOTE — Progress Notes (Signed)
Patient Name: Richard Burton Date of Encounter: 05/20/2014     Active Problems:   Chest pain   CAD (coronary artery disease)    SUBJECTIVE Seen in nuclear medicine for stress test. He tolerated procedure well. Feels feverish and chilled. Has watery diarrhea. No further CP or SOB. Feels like he might have the flu  CURRENT MEDS . ALPRAZolam  0.5 mg Oral BID  . aspirin EC  81 mg Oral Daily  . atorvastatin  40 mg Oral q1800  . clopidogrel  75 mg Oral Daily  . heparin  5,000 Units Subcutaneous 3 times per day  . lisinopril  5 mg Oral Daily  . metoprolol succinate  12.5 mg Oral Daily  . pantoprazole  40 mg Oral Daily  . sertraline  100 mg Oral Daily    OBJECTIVE  Filed Vitals:   05/19/14 1934 05/19/14 1945 05/19/14 2017 05/20/14 0902  BP: 122/70 121/60 119/69 134/75  Pulse: 57 55 53   Temp:   98.8 F (37.1 C)   TempSrc:   Oral   Resp: 16 10 18    Height:   5\' 11"  (1.803 m)   Weight:   214 lb 15.2 oz (97.5 kg)   SpO2: 96% 96% 98%    No intake or output data in the 24 hours ending 05/20/14 0927 Filed Weights   05/19/14 1510 05/19/14 2017  Weight: 222 lb (100.699 kg) 214 lb 15.2 oz (97.5 kg)    PHYSICAL EXAM  General: Pleasant, NAD. Neuro: Alert and oriented X 3. Moves all extremities spontaneously. Psych: Normal affect. HEENT:  Normal  Neck: Supple without bruits or JVD. Lungs:  Resp regular and unlabored, CTA. Heart: RRR no s3, s4, or murmurs. Abdomen: Soft, non-tender, non-distended, BS + x 4.  Extremities: No clubbing, cyanosis or edema. DP/PT/Radials 2+ and equal bilaterally.  Accessory Clinical Findings  CBC  Recent Labs  05/19/14 1518 05/19/14 1925  WBC 8.2 7.3  HGB 16.7 15.9  HCT 48.6 45.2  MCV 86.6 86.3  PLT 206 528   Basic Metabolic Panel  Recent Labs  05/19/14 1518 05/19/14 1925  NA 140  --   K 4.5  --   CL 104  --   CO2 22  --   GLUCOSE 97  --   BUN 12  --   CREATININE 1.14 1.07  CALCIUM 9.5  --    Cardiac Enzymes  Recent  Labs  05/19/14 1925 05/20/14 0110  TROPONINI <0.03 <0.03    TELE: NSR  Radiology/Studies  Dg Chest 2 View  05/19/2014   CLINICAL DATA:  Chest pain.  Feeling anxious.  EXAM: CHEST  2 VIEW  COMPARISON:  None.  FINDINGS: The heart size and mediastinal contours are within normal limits. Prior CABG. Both lungs are clear. Degenerative changes of bilateral acromioclavicular joints.  IMPRESSION: No active cardiopulmonary disease.   Electronically Signed   By: Kathreen Devoid   On: 05/19/2014 18:41     ASSESSMENT AND PLAN  Richard Burton is a 59 y.o.male hx of CAD w/ history of prior PCI and prior 4 vessel CABG4V CABG (LIMA to LAD, SVG to OM1, SVG to Ramus, SVG to PDA) October 2014 at [facility], PAD, HTN, COPD, tobacco abuse, HL, OSA presents with chest pain. Seen by Dr Angelena Form 05/09/14 for chest pain, outpatient stress test was ordered.  Reports a several week history of intermittent chest pain. Dull pain that can be left or right sided, 2-3/10 in severity. No other associated symptoms. Lasts for  just a few minutes. Occurs approx 2 times a day. Different from prior pain he has had at his prior CABG site. Has had some DOE as well.   Chest pain- somewhat atypical symptoms, he does have a known history of CAD with prior revascularization -- Troponin neg x2.  -- Leane Call today. Can likely be discharged home today if stress test negative  Diarrhea- watery and smells foul -- Will do Cdif PCR -- Mild temp 99.9 feg Richard Mu PA-C  Pager [phone]  The patient was seen, examined and discussed with Richard Range, PA-C and I agree with the above.   58 year old male with known CAD, s/p CABG, admitted with atypical chest pain, undergoing stress test today. ACS was ruled out. His only complain is GI - diarrhea, chills, might be acute viral enteritis. C.diff pending. If negative stress test, we will discharge home.   Richard Burton 05/20/2014

## 2014-05-22 ENCOUNTER — Telehealth: Payer: Self-pay | Admitting: Cardiovascular Disease

## 2014-05-22 NOTE — Telephone Encounter (Signed)
Hospital admission noted. cdm

## 2014-05-22 NOTE — Telephone Encounter (Signed)
New message    Patient calling went to emergency room recently  Would like a call to discuss    C/o still weak.

## 2014-05-22 NOTE — Telephone Encounter (Signed)
Spoke with pt. He reports he is under a great deal of stress and has problems with anxiety.  Reports blood pressure and heart rate went up after discharged from hospital. He took extra dose of Xanax and vital signs returned to normal. Pt's anxiety is followed by Dr. Bridgett Larsson and I asked pt to contact him to address stress and Xanax dosing.  Pt also complains of still feeling weak due to recent virus.  I asked pt to contact primary care to have these symptoms evaluated.  Per discharge summary pt needs 2-3 week post hospital follow up.  Appt made for pt to see Ignacia Bayley, PA on June 06, 2014 at 8:30

## 2014-05-24 ENCOUNTER — Other Ambulatory Visit: Payer: Self-pay | Admitting: Cardiovascular Disease

## 2014-05-26 ENCOUNTER — Encounter (HOSPITAL_COMMUNITY): Payer: Self-pay

## 2014-06-06 ENCOUNTER — Encounter: Payer: Self-pay | Admitting: Nurse Practitioner

## 2014-06-06 ENCOUNTER — Ambulatory Visit (INDEPENDENT_AMBULATORY_CARE_PROVIDER_SITE_OTHER): Payer: BLUE CROSS/BLUE SHIELD | Admitting: Nurse Practitioner

## 2014-06-06 VITALS — BP 102/68 | HR 59 | Ht 71.0 in | Wt 219.2 lb

## 2014-06-06 DIAGNOSIS — E785 Hyperlipidemia, unspecified: Secondary | ICD-10-CM

## 2014-06-06 DIAGNOSIS — J449 Chronic obstructive pulmonary disease, unspecified: Secondary | ICD-10-CM | POA: Insufficient documentation

## 2014-06-06 DIAGNOSIS — Z720 Tobacco use: Secondary | ICD-10-CM

## 2014-06-06 DIAGNOSIS — I10 Essential (primary) hypertension: Secondary | ICD-10-CM

## 2014-06-06 DIAGNOSIS — I2510 Atherosclerotic heart disease of native coronary artery without angina pectoris: Secondary | ICD-10-CM

## 2014-06-06 DIAGNOSIS — I25812 Atherosclerosis of bypass graft of coronary artery of transplanted heart without angina pectoris: Secondary | ICD-10-CM

## 2014-06-06 NOTE — Progress Notes (Signed)
Patient Name: Richard Burton Date of Encounter: 06/06/2014  Primary Care Provider:  Miguel Aschoff, MD Primary Cardiologist:  C. Angelena Form, MD   Chief Complaint  59 year old male with a history of coronary artery disease status post bypass surgery in 2014 who presents for hospital follow-up after recent admission secondary to chest pain.  Past Medical History   Past Medical History  Diagnosis Date  . CAD (coronary artery disease)     a. s/p 4V CABG 2014  b. 05/2014 MV: No ischemia, EF 69%.  Marland Kitchen COPD (chronic obstructive pulmonary disease)     ongoing tobacco use  . Hypertension     a. renal arteries patent on remote cath.  . Anxiety and depression     a. severe - seeing psychiatry.  . OSA (obstructive sleep apnea)     severe  . PAD (peripheral artery disease)     a. 01/2014 ABIs R: 0.92, L 1.05.  Know total occlusion of right SFA with distal reconstitution-->Med Rx.  . Chronic prostatitis   . Hyperlipidemia   . Tobacco abuse     a. 05/2014 down to 3-5 cigarettes day and vaping also.  . Obesity    Past Surgical History  Procedure Laterality Date  . Coronary artery bypass graft  October 2014    4V  . Knee arthroscopy      right meniscal tear  . Cholecystectomy    . Cystoscopy      Allergies  Allergies  Allergen Reactions  . Relafen [Nabumetone]   . Sulfa Antibiotics Diarrhea    HPI  59 year old male with the above problem list. He is status post bypass surgery at [facility] in 2014. Also has history of peripheral arterial disease with known right SFA occlusion. He was shown to have good distal flow on ABIs in September 2015. He was admitted to [facility] earlier this month with chest discomfort and diarrhea. He ruled out for MI. Stress testing was undertaken and was normal. Symptoms improved and he was discharged home. Since discharge, he has not been having chest pain or significant dyspnea on exertion. He does have some reflux symptoms and says that he is pending an EGD  and colonoscopy however he is very concerned about how instruments are cleaned and will not undergo EGD or colonoscopy until he is sure that instruments are cleaned properly. He admits to struggling with anxiety and depression is being followed closely by psychiatry. This is really his biggest issue today. He continues to smoke 3-5 cigarettes per day and also uses a vaporizer. He denies PND, orthopnea, dizziness, syncope, edema, or early satiety.  Home Medications  Prior to Admission medications   Medication Sig Start Date End Date Taking? Authorizing Provider  ALPRAZolam Duanne Moron) 1 MG tablet Take 0.5 tablets (0.5 mg total) by mouth 2 (two) times daily. Patient taking differently: Take 1 mg by mouth 3 (three) times daily.  11/08/10  Yes Tonia Ghent, MD  aspirin 81 MG EC tablet Take 81 mg by mouth daily.     Yes Historical Provider, MD  clopidogrel (PLAVIX) 75 MG tablet TAKE 1 TABLET BY MOUTH ONCE DAILY. 03/16/14  Yes Burnell Blanks, MD  esomeprazole (NEXIUM) 40 MG capsule Take 40 mg by mouth 2 (two) times daily before a meal.    Yes Historical Provider, MD  lisinopril (PRINIVIL,ZESTRIL) 5 MG tablet TAKE 1 TABLET BY MOUTH ONCE DAILY 05/25/14  Yes Burnell Blanks, MD  metoprolol succinate (TOPROL XL) 25 MG 24 hr tablet Take  0.5 tablets (12.5 mg total) by mouth daily. 01/30/14  Yes Burnell Blanks, MD  nitroGLYCERIN (NITROSTAT) 0.4 MG SL tablet Place 1 tablet (0.4 mg total) under the tongue every 5 (five) minutes as needed for chest pain. 05/20/14  Yes Eileen Stanford, PA-C  pentosan polysulfate (ELMIRON) 100 MG capsule Take 200 mg by mouth 2 (two) times daily.    Yes Historical Provider, MD  sertraline (ZOLOFT) 100 MG tablet Take 100 mg by mouth daily.  05/08/14  Yes Historical Provider, MD  terazosin (HYTRIN) 10 MG capsule Take 10 mg by mouth daily.    Yes Historical Provider, MD  atorvastatin (LIPITOR) 20 MG tablet Take 20 mg by mouth daily. 03/03/14   Historical Provider, MD      Review of Systems  As above, he has been dealing with significant depression and anxiety since his bypass surgery in 2014. He does have some reflux symptoms and has concerns about his ability to afford Nexium.  He denies chest pain, palpitations, dyspnea, pnd, orthopnea, n, v, dizziness, syncope, edema, weight gain, or early satiety.  All other systems reviewed and are otherwise negative except as noted above.  Physical Exam  Blood pressure 102/68, pulse 59, height 5\' 11"  (1.803 m), weight 219 lb 3.2 oz (99.428 kg).  General: Pleasant, NAD Psych: Normal affect. Neuro: Alert and oriented X 3. Moves all extremities spontaneously. HEENT: Normal  Neck: Supple without bruits or JVD. Lungs:  Resp regular and unlabored, diminished breath sounds bilaterally. Heart: RRR no s3, s4, or murmurs. Abdomen: Soft, non-tender, non-distended, BS + x 4.  Extremities: No clubbing, cyanosis or edema. DP/PT/Radials 1+ and equal bilaterally.  Accessory Clinical Findings  ECG - sinus bradycardia, 59, incomplete right bundle branch block, left axis deviation, left atrial enlargement. No acute ST or T changes.  Assessment & Plan  1.  Coronary artery disease: Status post prior coronary artery bypass grafting in 2014 at [facility]. He was recently admitted to [facility] with chest pain and ruled out. Stress testing was undertaken and was normal. He has had no further chest discomfort that he would describe as angina. He remains on aspirin, statin, Plavix, and beta blocker therapy.  2. Hypertension: Stable on beta blocker and ACE inhibitor.  3. Hyperlipidemia: He is on statin therapy and this is followed by his PCP. We do not have any lipids on file.  4. Tobacco abuse: He continues to smoke 3-5 cigarettes per day. He is also now using a vaporizer. He says he is motivated to quit but feels like he needs help. I've asked him to consider using nicotine patches and also to discuss the use of Wellbutrin with his  psychiatrist.  5. Anxiety and depression: Patient reports significant anxiety and depression ever since his bypass surgery. He says like he feels like he has PTSD. He is being followed by psychiatry and is currently on Zoloft and when necessary Xanax which he is using 3 times per day.  6. Disposition: Follow-up with Dr. Angelena Form in 6 months or sooner if necessary.   Murray Hodgkins, NP 06/06/2014, 9:14 AM

## 2014-06-06 NOTE — Patient Instructions (Signed)
Your physician recommends that you continue on your current medications as directed. Please refer to the Current Medication list given to you today.   Your physician wants you to follow-up in:  [place] will receive a reminder letter in the mail two months in advance. If you don't receive a letter, please call our office to schedule the follow-up appointment.

## 2014-06-16 ENCOUNTER — Other Ambulatory Visit: Payer: Self-pay

## 2014-06-16 MED ORDER — LISINOPRIL 5 MG PO TABS: 5.0000 mg | ORAL_TABLET | Freq: Every day | ORAL | Status: DC

## 2014-06-21 ENCOUNTER — Other Ambulatory Visit: Payer: Self-pay | Admitting: Cardiovascular Disease

## 2014-06-22 NOTE — Telephone Encounter (Signed)
Called [facility] and verified that lisinopril refill was received and ready for pt to pick up.

## 2014-07-11 ENCOUNTER — Telehealth: Payer: Self-pay | Admitting: Cardiovascular Disease

## 2014-07-11 NOTE — Telephone Encounter (Signed)
Completed form was faxed from [place] office on 2/24.  Medical records department in our office will refax today.  I spoke with Janett Billow at Dr. Caesar Bookman office and gave her this information.

## 2014-07-11 NOTE — Telephone Encounter (Signed)
07/05/14-Surgical clearance faxed to [place], rmf.

## 2014-07-11 NOTE — Telephone Encounter (Signed)
New message    Surgical form fax over on  2.24. Office checking on the status of this.    Request for surgical clearance:  1. What type of surgery is being performed? Oral surgery   2. When is this surgery scheduled? Pending    3. Are there any medications that need to be held prior to surgery and how long? Need to stop asa & plavix   4. Name of physician performing surgery? Dr. Lewanda Rife   5. What is your office phone and fax number? [phone] / fax [phone]

## 2014-07-15 ENCOUNTER — Other Ambulatory Visit: Payer: Self-pay | Admitting: Cardiovascular Disease

## 2014-10-10 ENCOUNTER — Telehealth: Payer: Self-pay | Admitting: Cardiovascular Disease

## 2014-10-10 NOTE — Telephone Encounter (Signed)
Routing to [employer] to address.  She is aware of pt symptoms/complaints.

## 2014-10-10 NOTE — Telephone Encounter (Signed)
Spoke with pt. He reports on 5/28 he was working inside putting up sheet rock. Felt dizzy a couple of times when changing positions and eventually leaned against wall and passed out. Was out for a couple of seconds. Blood pressure was low for him --120/80.  Felt OK on Sunday. On Monday was working outside and felt dizzy again when bending over. Did not pass out.  Today is at work and feeling OK. Was recently weaned off medication for anxiety by Dr. Bridgett Larsson. Last dose was on 5/27.  Pt also reports he has had stomach virus and has not been drinking or eating like usual.  I instructed pt to try to stay hydrated and to schedule appt with primary care for evaluation of symptoms.

## 2014-10-10 NOTE — Telephone Encounter (Signed)
Pt c/o Syncope: STAT if syncope occurred within 30 minutes and pt complains of lightheadedness High Priority if episode of passing out, completely, today or in last 24 hours   1. Did you pass out today? No   2. When is the last time you passed out? 05/28. Symptoms on 05/29 and symptoms on 05/30. He only had the black out on 05/28  3. Has this occurred multiple times? Yes   4. Did you have any symptoms prior to passing out?  Saturday he was working but he noticed that when he bends over and then stand up he gets very dizzy to the point that he almost blacks out. Once he felt it he held the wall   Comments: Pt states that he advised the doctor that he didn't feel comfortable taking MIRTAZAPINE 30MG  TABLETS. He states that he wanted to ease off of it. He believes that his symptoms are due to his medications.     Saturday he was working but he noticed that when he bends over and then stand up he gets very dizzy to the point that he almost blacks out. Once he felt it he held the wall

## 2014-10-10 NOTE — Telephone Encounter (Signed)
Agree. Thanks. cdm 

## 2015-01-01 ENCOUNTER — Telehealth: Payer: Self-pay | Admitting: Cardiovascular Disease

## 2015-01-01 NOTE — Telephone Encounter (Signed)
Spoke with pt. He reports he accidentally took one dose of his dog's oral allergy medicine on August 18,2016.  Pt reports he spoke with on call provider for cardiology who was going to check with pharmacist and call him back.  Pt calling today because he is upset he did not receive call back.  Pt reports he has since spoken with poison control and vet's office and has no questions at this time.  I offered to talk with our pharmacist but pt does not want me to do this.  Pt reports he felt "off in his sinuses" on Friday morning but that improved Friday afternoon.  Occasional episodes of light headedness when he bends over.  Pt mostly concerned about not receiving call back and wanted to make Dr. Angelena Form aware.  I told pt if this were to happen again he could call on call service and have provider contacted again.

## 2015-01-01 NOTE — Telephone Encounter (Signed)
New message     Pt spoke to cardiologist on call on 8-18 around 9-10 pm Pt states he spoke with on call physician/p.a. about problems and physician/p.a. was to call him back that evening and he has not heard back from them Please call to discuss situation that happened on 8-18

## 2015-01-02 NOTE — Telephone Encounter (Signed)
I think this should be ok. cdm

## 2015-01-08 ENCOUNTER — Encounter: Payer: Self-pay | Admitting: Family Medicine

## 2015-01-08 ENCOUNTER — Other Ambulatory Visit: Payer: Self-pay | Admitting: Cardiovascular Disease

## 2015-01-08 ENCOUNTER — Ambulatory Visit (INDEPENDENT_AMBULATORY_CARE_PROVIDER_SITE_OTHER): Payer: BLUE CROSS/BLUE SHIELD | Admitting: Family Medicine

## 2015-01-08 ENCOUNTER — Other Ambulatory Visit: Payer: Self-pay | Admitting: Family Medicine

## 2015-01-08 VITALS — BP 98/52 | HR 78 | Temp 98.7°F | Resp 12 | Wt 215.0 lb

## 2015-01-08 DIAGNOSIS — R42 Dizziness and giddiness: Secondary | ICD-10-CM

## 2015-01-08 NOTE — Progress Notes (Signed)
Patient ID: Richard Burton, male   DOB: 1956/02/26, 59 y.o.   MRN: [id number]    Subjective:  HPI  Anxiety/depression: Patient sees Dr. Bridgett Larsson and he follows patient for these issues and his symptoms are stable.  Tobacco user: Patient has quit in April 2016. Doing well with this. It is difficult at his wife is still smoking. He smells strongly of tobacco today.  Dizziness/pre syncope: Patient states he had been seen at ER for this and seen NP in cardiology department. He has had EKG done and stress test done. This is recurrent. He stays active and he has checked his B/P when he had these symptoms and his B/P was on the lower side. HE has been told it could be due to some medications also. He starts to "feel off" when he has been bending down a lot. He did pass out one time and thinks he saw cardiologist after this but is not sure. He is due to see cardiologist in October.   Prior to Admission medications   Medication Sig Start Date End Date Taking? Authorizing Provider  ALPRAZolam Duanne Moron) 1 MG tablet Take 0.5 tablets (0.5 mg total) by mouth 2 (two) times daily. Patient taking differently: Take 1 mg by mouth 3 (three) times daily.  11/08/10   Tonia Ghent, MD  aspirin 81 MG EC tablet Take 81 mg by mouth daily.      Historical Provider, MD  atorvastatin (LIPITOR) 20 MG tablet Take 20 mg by mouth daily. 03/03/14   Historical Provider, MD  clopidogrel (PLAVIX) 75 MG tablet TAKE 1 TABLET BY MOUTH ONCE DAILY 07/17/14   Burnell Blanks, MD  esomeprazole (NEXIUM) 40 MG capsule Take 40 mg by mouth 2 (two) times daily before a meal.     Historical Provider, MD  lisinopril (PRINIVIL,ZESTRIL) 5 MG tablet Take 1 tablet (5 mg total) by mouth daily. 06/16/14   Burnell Blanks, MD  metoprolol succinate (TOPROL XL) 25 MG 24 hr tablet Take 0.5 tablets (12.5 mg total) by mouth daily. 01/30/14   Burnell Blanks, MD  nitroGLYCERIN (NITROSTAT) 0.4 MG SL tablet Place 1 tablet (0.4 mg total)  under the tongue every 5 (five) minutes as needed for chest pain. 05/20/14   Eileen Stanford, PA-C  pentosan polysulfate (ELMIRON) 100 MG capsule Take 200 mg by mouth 2 (two) times daily.     Historical Provider, MD  sertraline (ZOLOFT) 100 MG tablet TAKE 1 1/2 TABLETS BY MOUTH EVERY DAY 01/08/15   Jerrol Banana., MD  terazosin (HYTRIN) 10 MG capsule Take 10 mg by mouth daily.     Historical Provider, MD    Patient Active Problem List   Diagnosis Date Noted  . Hypertension   . COPD (chronic obstructive pulmonary disease)   . Tobacco abuse   . PAD (peripheral artery disease)   . Obesity   . Chest pain 05/19/2014  . CAD (coronary artery disease) 05/19/2014  . Preop cardiovascular exam 08/20/2010  . URI 09/18/2009  . OTHER PERIPHERAL VASCULAR DISEASE 07/26/2008  . CAROTID BRUIT, LEFT 07/26/2008  . HYPERTENSION, BENIGN 07/25/2008  . CAD, NATIVE VESSEL 07/25/2008  . Lumbago 04/12/2008  . DEPRESSION, RECURRENT 12/26/2007  . BENIGN PROSTATIC HYPERTROPHY, WITH URINARY OBSTRUCTION 12/26/2007  . PROSTATITIS, RECURRENT 12/26/2007  . HLD (hyperlipidemia) 12/13/2007  . HYPERTENSION, ESSENTIAL 12/13/2007  . CHRONIC INTERSTITIAL CYSTITIS 12/13/2007  . CARPAL TUNNEL RELEASE, BILATERAL, HX OF 12/13/2007  . GERD 12/09/2007  . HYPERGLYCEMIA 12/09/2007  . MUSCLE CRAMPS  07/29/2007  . CHEST PAIN UNSPECIFIED 07/29/2007  . OSA (obstructive sleep apnea) 04/28/2007  . COPD, MILD 04/28/2007  . SOB 01/12/2007  . HX, PERSONAL, TOBACCO USE 01/12/2007  . Anxiety 12/03/2006    Past Medical History  Diagnosis Date  . CAD (coronary artery disease)     a. s/p 4V CABG 2014  b. 05/2014 MV: No ischemia, EF 69%.  Marland Kitchen COPD (chronic obstructive pulmonary disease)     ongoing tobacco use  . Hypertension     a. renal arteries patent on remote cath.  . Anxiety and depression     a. severe - seeing psychiatry.  . OSA (obstructive sleep apnea)     severe  . PAD (peripheral artery disease)     a. 01/2014  ABIs R: 0.92, L 1.05.  Know total occlusion of right SFA with distal reconstitution-->Med Rx.  . Chronic prostatitis   . Hyperlipidemia   . Tobacco abuse     a. 05/2014 down to 3-5 cigarettes day and vaping also.  . Obesity     Social History   Social History  . Marital Status: Married    Spouse Name: N/A  . Number of Children: N/A  . Years of Education: N/A   Occupational History  . Not on file.   Social History Main Topics  . Smoking status: Former Smoker -- 1.00 packs/day for 40 years    Types: Cigarettes    Quit date: 08/11/2014  . Smokeless tobacco: Never Used  . Alcohol Use: No  . Drug Use: No  . Sexual Activity: Not on file   Other Topics Concern  . Not on file   Social History Narrative    Allergies  Allergen Reactions  . Relafen [Nabumetone]   . Sulfa Antibiotics Diarrhea    Review of Systems  Constitutional: Positive for malaise/fatigue. Negative for fever and chills.  HENT: Negative.   Eyes: Negative.   Respiratory: Negative.   Cardiovascular: Negative.   Genitourinary: Negative.   Musculoskeletal: Negative.   Neurological: Positive for dizziness. Negative for headaches.  Psychiatric/Behavioral: The patient is nervous/anxious (better).     Immunization History  Administered Date(s) Administered  . Influenza Whole 03/12/2006  . Td 12/18/2004   Objective:  BP 98/52 mmHg  Pulse 78  Temp(Src) 98.7 F (37.1 C)  Resp 12  Wt 215 lb (97.523 kg)  Physical Exam  Constitutional: He is oriented to person, place, and time and well-developed, well-nourished, and in no distress.  HENT:  Head: Normocephalic and atraumatic.  Right Ear: External ear normal.  Left Ear: External ear normal.  Nose: Nose normal.  Eyes: Conjunctivae are normal.  Neck: Neck supple.  Cardiovascular: Normal rate, regular rhythm and normal heart sounds.   Pulmonary/Chest: Effort normal and breath sounds normal.  Abdominal: Soft.  Neurological: He is alert and oriented to  person, place, and time. Gait normal.  Skin: Skin is warm and dry.  Psychiatric: Mood, memory, affect and judgment normal.    Lab Results  Component Value Date   WBC 7.3 05/19/2014   HGB 15.9 05/19/2014   HCT 45.2 05/19/2014   PLT 188 05/19/2014   GLUCOSE 97 05/19/2014   CHOL 100 03/16/2014   TRIG 114 03/16/2014   HDL 31* 03/16/2014   LDLDIRECT 122.5 12/12/2008   LDLCALC 46 03/16/2014   TSH 1.58 12/12/2008   PSA 0.68 12/12/2008   INR 1.0 06/23/2008   HGBA1C 5.2 02/04/2006   MICROALBUR 3.1* 12/12/2008    CMP     Component  Value Date/Time   NA 140 05/19/2014 1518   NA 139 03/16/2014 1325   K 4.5 05/19/2014 1518   K 4.3 03/16/2014 1325   CL 104 05/19/2014 1518   CL 105 03/16/2014 1325   CO2 22 05/19/2014 1518   CO2 29 03/16/2014 1325   GLUCOSE 97 05/19/2014 1518   GLUCOSE 86 03/16/2014 1325   BUN 12 05/19/2014 1518   BUN 12 03/16/2014 1325   CREATININE 1.07 05/19/2014 1925   CREATININE 1.04 03/16/2014 1325   CALCIUM 9.5 05/19/2014 1518   CALCIUM 8.6 03/16/2014 1325   PROT 7.2 03/16/2014 1325   PROT 7.0 12/12/2008 0840   ALBUMIN 3.8 03/16/2014 1325   ALBUMIN 3.9 12/12/2008 0840   AST 22 03/16/2014 1325   AST 18 12/12/2008 0840   ALT 28 03/16/2014 1325   ALT 22 12/12/2008 0840   ALKPHOS 105 03/16/2014 1325   ALKPHOS 73 12/12/2008 0840   BILITOT 0.6 03/16/2014 1325   BILITOT 0.8 12/12/2008 0840   GFRNONAA 76* 05/19/2014 1925   GFRNONAA >60 03/16/2014 1325   GFRAA 88* 05/19/2014 1925   GFRAA >60 03/16/2014 1325    Assessment and Plan :  CAD All risk factors treated Tobacco abuse I praised the patient for stopping in April. Hyperlipidemia Hypertension Patient is having some symptoms of orthostasis. At this time we'll stop his Hytrin and see how he does with orthostasis and with his BPH symptoms BPH Chronic depression and anxiety Followed by psychiatry-Dr. Bridgett Larsson. GERD I have done the exam and reviewed the above chart and it is accurate to the best of my  knowledge. Dizziness/presyncope Stop Hytrin for now. See him back in a couple weeks. Stay hydrated.     Miguel Aschoff MD [facility] 01/08/2015 4:03 PM

## 2015-01-15 ENCOUNTER — Other Ambulatory Visit: Payer: Self-pay | Admitting: Cardiovascular Disease

## 2015-01-19 ENCOUNTER — Encounter: Payer: Self-pay | Admitting: Cardiovascular Disease

## 2015-01-19 ENCOUNTER — Ambulatory Visit (INDEPENDENT_AMBULATORY_CARE_PROVIDER_SITE_OTHER): Payer: BLUE CROSS/BLUE SHIELD | Admitting: Cardiovascular Disease

## 2015-01-19 VITALS — BP 98/60 | HR 61 | Ht 71.0 in | Wt 215.9 lb

## 2015-01-19 DIAGNOSIS — E785 Hyperlipidemia, unspecified: Secondary | ICD-10-CM

## 2015-01-19 DIAGNOSIS — I2510 Atherosclerotic heart disease of native coronary artery without angina pectoris: Secondary | ICD-10-CM

## 2015-01-19 DIAGNOSIS — I739 Peripheral vascular disease, unspecified: Secondary | ICD-10-CM | POA: Diagnosis not present

## 2015-01-19 DIAGNOSIS — I10 Essential (primary) hypertension: Secondary | ICD-10-CM | POA: Diagnosis not present

## 2015-01-19 DIAGNOSIS — R42 Dizziness and giddiness: Secondary | ICD-10-CM

## 2015-01-19 DIAGNOSIS — F17201 Nicotine dependence, unspecified, in remission: Secondary | ICD-10-CM

## 2015-01-19 MED ORDER — LISINOPRIL 2.5 MG PO TABS: 2.5000 mg | ORAL_TABLET | Freq: Every day | ORAL | Status: DC

## 2015-01-19 NOTE — Progress Notes (Signed)
Chief Complaint  Patient presents with  . Dizziness      History of Present Illness: 59 yo male with history of CAD s/p PCI/stent followed by 4V CABG (LIMA to LAD, SVG to OM1, SVG to Ramus, SVG to PDA) October 2014 at [facility], PAD, HTN, COPD, tobacco abuse, HLD, sleep apnea here today for cardiac follow up. I saw him as a new patient September 2015 to establish cardiology care. He has been seen in our [place] office in April 2012 by Dr. Rockey Situ and has been followed over the last year by Dr. Saralyn Pilar in [place]. Admitted to [facility] October 2014 with chest pain and underwent 4V CABG. Also has COPD and OSA. He is known to have PAD with occluded right SFA. Lower extremity dopplers in our office 02/01/14 with normal ABI on the left and slight reduction of ABI (0.92) on the right with documentation of occluded right SFA. [facility] with chest pain. Troponin negative. Stress myoview 05/19/14 with no ischemia. Seen in follow up here by Ignacia Bayley, NP. Pt was being considered for EGD and colonoscopy. He also has been dealing with severe anxiety.   He is here today for follow up. Overall feeling well. He has had occasional soreness over left chest wall. This is not exertional. No dyspnea. He has occasional dizzy spells when first standing from leaning over working. NO syncope.    Primary Care Physician: Dr. Miguel Aschoff  Last Lipid Profile: Followed in primary care.   Past Medical History  Diagnosis Date  . CAD (coronary artery disease)     a. s/p 4V CABG 2014  b. 05/2014 MV: No ischemia, EF 69%.  Marland Kitchen COPD (chronic obstructive pulmonary disease)     ongoing tobacco use  . Hypertension     a. renal arteries patent on remote cath.  . Anxiety and depression     a. severe - seeing psychiatry.  . OSA (obstructive sleep apnea)     severe  . PAD (peripheral artery disease)     a. 01/2014 ABIs R: 0.92, L 1.05.  Know total occlusion of right SFA with distal reconstitution-->Med Rx.  .  Chronic prostatitis   . Hyperlipidemia   . Tobacco abuse     a. 05/2014 down to 3-5 cigarettes day and vaping also.  . Obesity     Past Surgical History  Procedure Laterality Date  . Coronary artery bypass graft  October 2014    4V  . Knee arthroscopy      right meniscal tear  . Cholecystectomy    . Cystoscopy      Current Outpatient Prescriptions  Medication Sig Dispense Refill  . ALPRAZolam (XANAX) 1 MG tablet Take 1 mg by mouth 3 (three) times daily as needed for anxiety.    Marland Kitchen aspirin 81 MG EC tablet Take 81 mg by mouth daily.      Marland Kitchen atorvastatin (LIPITOR) 20 MG tablet Take 20 mg by mouth daily.  11  . clopidogrel (PLAVIX) 75 MG tablet TAKE 1 TABLET BY MOUTH EVERY DAY 30 tablet 6  . esomeprazole (NEXIUM) 40 MG capsule Take 40 mg by mouth 2 (two) times daily before a meal.     . lisinopril (PRINIVIL,ZESTRIL) 2.5 MG tablet Take 1 tablet (2.5 mg total) by mouth daily. 30 tablet 11  . metoprolol succinate (TOPROL XL) 25 MG 24 hr tablet Take 0.5 tablets (12.5 mg total) by mouth daily. 15 tablet 11  . nitroGLYCERIN (NITROSTAT) 0.4 MG SL tablet Place  1 tablet (0.4 mg total) under the tongue every 5 (five) minutes as needed for chest pain. 25 tablet 12  . pentosan polysulfate (ELMIRON) 100 MG capsule Take 200 mg by mouth 2 (two) times daily.     . sertraline (ZOLOFT) 100 MG tablet TAKE 1 1/2 TABLETS BY MOUTH EVERY DAY 45 tablet 3  . terazosin (HYTRIN) 10 MG capsule Take 10 mg by mouth daily.      No current facility-administered medications for this visit.    Allergies  Allergen Reactions  . Sulfa Antibiotics Diarrhea  . Relafen [Nabumetone] Other (See Comments)    Pt doesn't not remember     Social History   Social History  . Marital Status: Married    Spouse Name: N/A  . Number of Children: N/A  . Years of Education: N/A   Occupational History  . Not on file.   Social History Main Topics  . Smoking status: Former Smoker -- 1.00 packs/day for 40 years    Types:  Cigarettes    Quit date: 08/11/2014  . Smokeless tobacco: Never Used  . Alcohol Use: No  . Drug Use: No  . Sexual Activity: Not on file   Other Topics Concern  . Not on file   Social History Narrative    Family History  Problem Relation Age of Onset  . Hypertension Mother   . Cataracts Mother   . Diabetes Father     Review of Systems:  As stated in the HPI and otherwise negative.   BP 98/60 mmHg  Pulse 61  Ht 5\' 11"  (1.803 m)  Wt 215 lb 14.4 oz (97.932 kg)  BMI 30.13 kg/m2  SpO2 96%  Physical Examination: General: Well developed, well nourished, NAD HEENT: OP clear, mucus membranes moist SKIN: warm, dry. No rashes. Neuro: No focal deficits Musculoskeletal: Muscle strength 5/5 all ext Psychiatric: Mood and affect normal Neck: No JVD, no carotid bruits, no thyromegaly, no lymphadenopathy. Lungs:Clear bilaterally, no wheezes, rhonci, crackles Cardiovascular: Regular rate and rhythm. No murmurs, gallops or rubs. Abdomen:Soft. Bowel sounds present. Non-tender.  Extremities: No lower extremity edema. Pulses are 2 + in the bilateral DP/PT.  EKG:  EKG is not ordered today. The ekg ordered today demonstrates   Recent Labs: 03/16/2014: SGPT (ALT) 28 05/19/2014: B Natriuretic Peptide 25.4; BUN 12; Creatinine, Ser 1.07; Hemoglobin 15.9; Platelets 188; Potassium 4.5; Sodium 140   Lipid Panel    Component Value Date/Time   CHOL 100 03/16/2014 1325   CHOL 187 12/12/2008 0840   TRIG 114 03/16/2014 1325   TRIG 207.0* 12/12/2008 0840   HDL 31* 03/16/2014 1325   HDL 31.20* 12/12/2008 0840   CHOLHDL 6 12/12/2008 0840   VLDL 23 03/16/2014 1325   VLDL 41.4* 12/12/2008 0840   LDLCALC 46 03/16/2014 1325   LDLDIRECT 122.5 12/12/2008 0840     Wt Readings from Last 3 Encounters:  01/19/15 215 lb 14.4 oz (97.932 kg)  01/08/15 215 lb (97.523 kg)  06/06/14 219 lb 3.2 oz (99.428 kg)     Other studies Reviewed: Additional studies/ records that were reviewed today include:  . Review of the above records demonstrates:    Assessment and Plan:   1. CAD: s/p 4V CABG in October 2014 at [facility].Stress myoview without ischemia January 2016. He is having no chest pain. He is on good medical therapy. He is on a ASA, Plavix, statin, Lopressor and Ace-inh.   2. HTN: BP controlled and actually low. Will lower Lisinopril.   3. HLD:  Continue statin. Lipids followed in primary care office.   4. Tobacco abuse, in remission: He has stopped smoking. He is occasionally using a vaporizer with no nicotine.   5. PAD: Known to have occluded right SFA. ABI 02/01/14 slightly decreased on right, normal on left.  He has minimal pain with walking.   6. Dizziness: Orthostatics are negative. His BP is a little soft. Will lower Lisinopril to 2.5 mg po Qdaily. I have encouraged good po intake.   Current medicines are reviewed at length with the patient today.  The patient does not have concerns regarding medicines.  The following changes have been made:  no change  Labs/ tests ordered today include:  No orders of the defined types were placed in this encounter.    Disposition:   FU with me in 6 months  Signed, Lauree Chandler, MD 01/19/2015 5:14 PM    [facility] [place], [place], [place]  [address] Phone: [phone]; Fax: [phone]

## 2015-01-19 NOTE — Patient Instructions (Signed)
Medication Instructions:  Your physician has recommended you make the following change in your medication: Decrease lisinopril to 2.5 mg by mouth daily.   Labwork: none  Testing/Procedures: none  Follow-Up: Your physician wants you to follow-up in: 6 months.  You will receive a reminder letter in the mail two months in advance. If you don't receive a letter, please call our office to schedule the follow-up appointment.   Any Other Special Instructions Will Be Listed Below (If Applicable).

## 2015-01-30 ENCOUNTER — Ambulatory Visit: Payer: BLUE CROSS/BLUE SHIELD | Admitting: Family Medicine

## 2015-02-06 ENCOUNTER — Other Ambulatory Visit: Payer: Self-pay | Admitting: Cardiovascular Disease

## 2015-04-10 ENCOUNTER — Other Ambulatory Visit: Payer: Self-pay | Admitting: Family Medicine

## 2015-05-13 ENCOUNTER — Other Ambulatory Visit: Payer: Self-pay | Admitting: Family Medicine

## 2015-06-04 ENCOUNTER — Telehealth: Payer: Self-pay | Admitting: Cardiovascular Disease

## 2015-06-04 NOTE — Telephone Encounter (Signed)
New message     Pt had a stent placed 6-31yrs ago.  Can he have an MRI?

## 2015-06-04 NOTE — Telephone Encounter (Signed)
Spoke with pt and told him it was OK to have MRI

## 2015-06-26 DIAGNOSIS — M75102 Unspecified rotator cuff tear or rupture of left shoulder, not specified as traumatic: Secondary | ICD-10-CM

## 2015-06-26 DIAGNOSIS — M12812 Other specific arthropathies, not elsewhere classified, left shoulder: Secondary | ICD-10-CM | POA: Insufficient documentation

## 2015-08-03 ENCOUNTER — Ambulatory Visit (INDEPENDENT_AMBULATORY_CARE_PROVIDER_SITE_OTHER): Payer: BLUE CROSS/BLUE SHIELD | Admitting: Cardiovascular Disease

## 2015-08-03 ENCOUNTER — Encounter: Payer: Self-pay | Admitting: Cardiovascular Disease

## 2015-08-03 VITALS — BP 138/82 | HR 53 | Ht 72.0 in | Wt 218.0 lb

## 2015-08-03 DIAGNOSIS — I10 Essential (primary) hypertension: Secondary | ICD-10-CM

## 2015-08-03 DIAGNOSIS — I739 Peripheral vascular disease, unspecified: Secondary | ICD-10-CM

## 2015-08-03 DIAGNOSIS — E785 Hyperlipidemia, unspecified: Secondary | ICD-10-CM | POA: Diagnosis not present

## 2015-08-03 DIAGNOSIS — I2510 Atherosclerotic heart disease of native coronary artery without angina pectoris: Secondary | ICD-10-CM | POA: Diagnosis not present

## 2015-08-03 MED ORDER — NITROGLYCERIN 0.4 MG SL SUBL: 0.4000 mg | SUBLINGUAL_TABLET | SUBLINGUAL | Status: DC | PRN

## 2015-08-03 MED ORDER — ATORVASTATIN CALCIUM 20 MG PO TABS: ORAL_TABLET | ORAL | Status: DC

## 2015-08-03 NOTE — Progress Notes (Signed)
Chief Complaint  Patient presents with  . Follow-up  . PAD    pt had some pain in chest this morning   . Coronary Artery Disease    History of Present Illness: 60 yo male with history of CAD s/p PCI/stent followed by 4V CABG (LIMA to LAD, SVG to OM1, SVG to Ramus, SVG to PDA) October 2014 at [facility], PAD, HTN, COPD, tobacco abuse, HLD, sleep apnea here today for cardiac follow up. I saw him as a new patient September 2015 to establish cardiology care. He has been seen in our [place] office in April 2012 by Dr. Rockey Situ and had been followed after that by Dr. Saralyn Pilar in [place]. Admitted to [facility] October 2014 with chest pain and underwent 4V CABG. Also has COPD and OSA. He is known to have PAD with occluded right SFA. Lower extremity dopplers in our office 02/01/14 with normal ABI on the left and slight reduction of ABI (0.92) on the right with documentation of occluded right SFA. Admitted to [facility] January 2016 with chest pain. Troponin negative. Stress myoview 05/19/14 with no ischemia. Seen in follow up here by Ignacia Bayley, NP. Pt has been considering EGD and colonoscopy. He also has been dealing with severe anxiety. Some dizziness a few months back and Lisinopril and metoprolol were stopped in primary care.   He is here today for follow up. Overall feeling well. He has had occasional soreness over left chest wall. This is not exertional. Chest pain at rest this am. No dyspnea. No syncope.    Primary Care Physician: Dr. Rebecka Apley  Past Medical History  Diagnosis Date  . CAD (coronary artery disease)     a. s/p 4V CABG 2014  b. 05/2014 MV: No ischemia, EF 69%.  Marland Kitchen COPD (chronic obstructive pulmonary disease) ([place])     ongoing tobacco use  . Hypertension     a. renal arteries patent on remote cath.  . Anxiety and depression     a. severe - seeing psychiatry.  . OSA (obstructive sleep apnea)     severe  . PAD (peripheral artery disease) ([place])     a. 01/2014 ABIs R: 0.92, L 1.05.  Know total  occlusion of right SFA with distal reconstitution-->Med Rx.  . Chronic prostatitis   . Hyperlipidemia   . Tobacco abuse     a. 05/2014 down to 3-5 cigarettes day and vaping also.  . Obesity     Past Surgical History  Procedure Laterality Date  . Coronary artery bypass graft  October 2014    4V  . Knee arthroscopy      right meniscal tear  . Cholecystectomy    . Cystoscopy      Current Outpatient Prescriptions  Medication Sig Dispense Refill  . ALPRAZolam (XANAX) 1 MG tablet Take 0.05 mg by mouth 3 (three) times daily as needed for anxiety.     . clopidogrel (PLAVIX) 75 MG tablet TAKE 1 TABLET BY MOUTH EVERY DAY 30 tablet 6  . nitroGLYCERIN (NITROSTAT) 0.4 MG SL tablet Place 1 tablet (0.4 mg total) under the tongue every 5 (five) minutes as needed for chest pain. 25 tablet 6  . pentosan polysulfate (ELMIRON) 100 MG capsule Take 200 mg by mouth 2 (two) times daily.     . sertraline (ZOLOFT) 100 MG tablet TAKE 1 1/2 TABLETS BY MOUTH EVERY DAY 45 tablet 0  . atorvastatin (LIPITOR) 20 MG tablet Take one tablet by mouth 3 days per week 15 tablet 11  No current facility-administered medications for this visit.    Allergies  Allergen Reactions  . Sulfa Antibiotics Diarrhea  . Relafen [Nabumetone] Other (See Comments)    Pt doesn't not remember     Social History   Social History  . Marital Status: Married    Spouse Name: N/A  . Number of Children: N/A  . Years of Education: N/A   Occupational History  . Not on file.   Social History Main Topics  . Smoking status: Former Smoker -- 1.00 packs/day for 40 years    Types: Cigarettes    Quit date: 08/11/2014  . Smokeless tobacco: Never Used  . Alcohol Use: No  . Drug Use: No  . Sexual Activity: Not on file   Other Topics Concern  . Not on file   Social History Narrative    Family History  Problem Relation Age of Onset  . Hypertension Mother   . Cataracts Mother   . Diabetes Father     Review of Systems:  As  stated in the HPI and otherwise negative.   BP 138/82 mmHg  Pulse 53  Ht 6' (1.829 m)  Wt 218 lb (98.884 kg)  BMI 29.56 kg/m2  SpO2 98%  Physical Examination: General: Well developed, well nourished, NAD HEENT: OP clear, mucus membranes moist SKIN: warm, dry. No rashes. Neuro: No focal deficits Musculoskeletal: Muscle strength 5/5 all ext Psychiatric: Mood and affect normal Neck: No JVD, no carotid bruits, no thyromegaly, no lymphadenopathy. Lungs:Clear bilaterally, no wheezes, rhonci, crackles Cardiovascular: Regular rate and rhythm. No murmurs, gallops or rubs. Abdomen:Soft. Bowel sounds present. Non-tender.  Extremities: No lower extremity edema. Pulses are 2 + in the bilateral DP/PT.  EKG:  EKG is ordered today. The ekg ordered today demonstrates sinus brady, rate 53 bpm. Incomplete RBBB  Recent Labs: No results found for requested labs within last 365 days.   Lipid Panel    Component Value Date/Time   CHOL 100 03/16/2014 1325   CHOL 187 12/12/2008 0840   TRIG 114 03/16/2014 1325   TRIG 207.0* 12/12/2008 0840   HDL 31* 03/16/2014 1325   HDL 31.20* 12/12/2008 0840   CHOLHDL 6 12/12/2008 0840   VLDL 23 03/16/2014 1325   VLDL 41.4* 12/12/2008 0840   LDLCALC 46 03/16/2014 1325   LDLDIRECT 122.5 12/12/2008 0840     Wt Readings from Last 3 Encounters:  08/03/15 218 lb (98.884 kg)  01/19/15 215 lb 14.4 oz (97.932 kg)  01/08/15 215 lb (97.523 kg)     Other studies Reviewed: Additional studies/ records that were reviewed today include: . Review of the above records demonstrates:    Assessment and Plan:   1. CAD: s/p 4V CABG in October 2014 at [facility].Stress myoview without ischemia January 2016. He is having no chest pain. He is on good medical therapy. He is on a ASA, Plavix, statin, Lopressor and Ace-inh.   2. HTN: BP controlled.  NO current meds.   3. HLD: Will restart statin (lipitor 20 mg every other day) and see if he can tolerate. Lipids followed in primary  care office. Will get records from primary care.   4. Tobacco abuse, in remission: He has stopped smoking.   5. PAD: Known to have occluded right SFA. ABI 02/01/14 slightly decreased on right, normal on left.  He has minimal pain with walking. Repeat ABI now.   Current medicines are reviewed at length with the patient today.  The patient does not have concerns regarding medicines.  The  following changes have been made:  no change  Labs/ tests ordered today include:   Orders Placed This Encounter  Procedures  . Lipid Profile  . Hepatic function panel  . EKG 12-Lead    Disposition:   FU with me in 6 months  Signed, Lauree Chandler, MD 08/03/2015 5:45 PM    [facility], [place], [place]  [address] Phone: [phone]; Fax: [phone]

## 2015-08-03 NOTE — Patient Instructions (Signed)
Medication Instructions:  Your physician has recommended you make the following change in your medication:  Start atorvastatin 20 mg by mouth three days per week.    Labwork: Your physician recommends that you return for fasting lab work in: 12 weeks.   Testing/Procedures: Your physician has requested that you have an ankle brachial index (ABI). During this test an ultrasound and blood pressure cuff are used to evaluate the arteries that supply the arms and legs with blood. Allow thirty minutes for this exam. There are no restrictions or special instructions.   Follow-Up: Your physician wants you to follow-up in: 6 months.  You will receive a reminder letter in the mail two months in advance. If you don't receive a letter, please call our office to schedule the follow-up appointment.   Any Other Special Instructions Will Be Listed Below (If Applicable).     If you need a refill on your cardiac medications before your next appointment, please call your pharmacy.

## 2015-08-06 ENCOUNTER — Telehealth: Payer: Self-pay | Admitting: Cardiovascular Disease

## 2015-08-06 NOTE — Telephone Encounter (Signed)
Records received via fax from [facility] placed in Chart prep room.

## 2015-08-09 ENCOUNTER — Inpatient Hospital Stay (HOSPITAL_COMMUNITY): Admission: RE | Admit: 2015-08-09 | Payer: BLUE CROSS/BLUE SHIELD | Source: Ambulatory Visit

## 2015-09-18 DIAGNOSIS — R1909 Other intra-abdominal and pelvic swelling, mass and lump: Secondary | ICD-10-CM | POA: Diagnosis not present

## 2015-09-25 DIAGNOSIS — R300 Dysuria: Secondary | ICD-10-CM | POA: Diagnosis not present

## 2015-09-25 DIAGNOSIS — K591 Functional diarrhea: Secondary | ICD-10-CM | POA: Diagnosis not present

## 2015-10-19 DIAGNOSIS — I119 Hypertensive heart disease without heart failure: Secondary | ICD-10-CM | POA: Diagnosis not present

## 2015-10-19 DIAGNOSIS — G4730 Sleep apnea, unspecified: Secondary | ICD-10-CM | POA: Diagnosis not present

## 2015-10-19 DIAGNOSIS — N3020 Other chronic cystitis without hematuria: Secondary | ICD-10-CM | POA: Diagnosis not present

## 2015-10-19 DIAGNOSIS — R001 Bradycardia, unspecified: Secondary | ICD-10-CM | POA: Diagnosis not present

## 2015-10-26 ENCOUNTER — Other Ambulatory Visit (INDEPENDENT_AMBULATORY_CARE_PROVIDER_SITE_OTHER): Payer: BLUE CROSS/BLUE SHIELD | Admitting: *Deleted

## 2015-10-26 DIAGNOSIS — E785 Hyperlipidemia, unspecified: Secondary | ICD-10-CM | POA: Diagnosis not present

## 2015-10-26 DIAGNOSIS — I2510 Atherosclerotic heart disease of native coronary artery without angina pectoris: Secondary | ICD-10-CM

## 2015-10-26 LAB — HEPATIC FUNCTION PANEL
ALT: 16 U/L (ref 9–46)
AST: 19 U/L (ref 10–35)
Albumin: 4.3 g/dL (ref 3.6–5.1)
Alkaline Phosphatase: 69 U/L (ref 40–115)
BILIRUBIN INDIRECT: 0.5 mg/dL (ref 0.2–1.2)
Bilirubin, Direct: 0.1 mg/dL (ref <=0.2)
TOTAL PROTEIN: 7.1 g/dL (ref 6.1–8.1)
Total Bilirubin: 0.6 mg/dL (ref 0.2–1.2)

## 2015-10-26 LAB — LIPID PANEL
CHOLESTEROL: 153 mg/dL (ref 125–200)
HDL: 51 mg/dL (ref >=40)
LDL Cholesterol: 89 mg/dL (ref <130)
TRIGLYCERIDES: 65 mg/dL (ref <150)
Total CHOL/HDL Ratio: 3 Ratio (ref <=5.0)
VLDL: 13 mg/dL (ref <30)

## 2015-10-26 NOTE — Addendum Note (Signed)
Addended by: Eulis Foster on: 10/26/2015 07:44 AM   Modules accepted: Orders

## 2015-10-29 DIAGNOSIS — N3020 Other chronic cystitis without hematuria: Secondary | ICD-10-CM | POA: Diagnosis not present

## 2015-10-29 DIAGNOSIS — M5441 Lumbago with sciatica, right side: Secondary | ICD-10-CM | POA: Diagnosis not present

## 2015-10-30 DIAGNOSIS — N3010 Interstitial cystitis (chronic) without hematuria: Secondary | ICD-10-CM | POA: Diagnosis not present

## 2015-10-30 DIAGNOSIS — R350 Frequency of micturition: Secondary | ICD-10-CM | POA: Diagnosis not present

## 2015-11-06 DIAGNOSIS — N3090 Cystitis, unspecified without hematuria: Secondary | ICD-10-CM | POA: Diagnosis not present

## 2015-11-06 DIAGNOSIS — S43421S Sprain of right rotator cuff capsule, sequela: Secondary | ICD-10-CM | POA: Diagnosis not present

## 2015-11-06 DIAGNOSIS — I25810 Atherosclerosis of coronary artery bypass graft(s) without angina pectoris: Secondary | ICD-10-CM | POA: Diagnosis not present

## 2015-11-06 DIAGNOSIS — M5441 Lumbago with sciatica, right side: Secondary | ICD-10-CM | POA: Diagnosis not present

## 2016-02-21 DIAGNOSIS — Z23 Encounter for immunization: Secondary | ICD-10-CM | POA: Diagnosis not present

## 2016-03-03 DIAGNOSIS — I453 Trifascicular block: Secondary | ICD-10-CM | POA: Diagnosis not present

## 2016-03-03 DIAGNOSIS — N400 Enlarged prostate without lower urinary tract symptoms: Secondary | ICD-10-CM | POA: Diagnosis not present

## 2016-03-03 DIAGNOSIS — I119 Hypertensive heart disease without heart failure: Secondary | ICD-10-CM | POA: Diagnosis not present

## 2016-03-03 DIAGNOSIS — F418 Other specified anxiety disorders: Secondary | ICD-10-CM | POA: Diagnosis not present

## 2016-03-07 DIAGNOSIS — E784 Other hyperlipidemia: Secondary | ICD-10-CM | POA: Diagnosis not present

## 2016-03-07 DIAGNOSIS — I10 Essential (primary) hypertension: Secondary | ICD-10-CM | POA: Diagnosis not present

## 2016-03-07 DIAGNOSIS — R5381 Other malaise: Secondary | ICD-10-CM | POA: Diagnosis not present

## 2016-03-21 DIAGNOSIS — N3090 Cystitis, unspecified without hematuria: Secondary | ICD-10-CM | POA: Diagnosis not present

## 2016-03-21 DIAGNOSIS — R55 Syncope and collapse: Secondary | ICD-10-CM | POA: Diagnosis not present

## 2016-03-21 DIAGNOSIS — G4730 Sleep apnea, unspecified: Secondary | ICD-10-CM | POA: Diagnosis not present

## 2016-03-21 DIAGNOSIS — N3020 Other chronic cystitis without hematuria: Secondary | ICD-10-CM | POA: Diagnosis not present

## 2016-04-07 DIAGNOSIS — I453 Trifascicular block: Secondary | ICD-10-CM | POA: Diagnosis not present

## 2016-04-07 DIAGNOSIS — R001 Bradycardia, unspecified: Secondary | ICD-10-CM | POA: Diagnosis not present

## 2016-04-07 DIAGNOSIS — N419 Inflammatory disease of prostate, unspecified: Secondary | ICD-10-CM | POA: Diagnosis not present

## 2016-04-07 DIAGNOSIS — G4730 Sleep apnea, unspecified: Secondary | ICD-10-CM | POA: Diagnosis not present

## 2016-04-07 DIAGNOSIS — N3090 Cystitis, unspecified without hematuria: Secondary | ICD-10-CM | POA: Diagnosis not present

## 2016-04-30 DIAGNOSIS — I25810 Atherosclerosis of coronary artery bypass graft(s) without angina pectoris: Secondary | ICD-10-CM | POA: Diagnosis not present

## 2016-04-30 DIAGNOSIS — N3020 Other chronic cystitis without hematuria: Secondary | ICD-10-CM | POA: Diagnosis not present

## 2016-04-30 DIAGNOSIS — I739 Peripheral vascular disease, unspecified: Secondary | ICD-10-CM | POA: Diagnosis not present

## 2016-04-30 DIAGNOSIS — I119 Hypertensive heart disease without heart failure: Secondary | ICD-10-CM | POA: Diagnosis not present

## 2016-04-30 DIAGNOSIS — S43421S Sprain of right rotator cuff capsule, sequela: Secondary | ICD-10-CM | POA: Diagnosis not present

## 2016-05-30 ENCOUNTER — Other Ambulatory Visit: Payer: Self-pay | Admitting: Cardiovascular Disease

## 2016-06-26 ENCOUNTER — Telehealth: Payer: Self-pay | Admitting: Cardiovascular Disease

## 2016-06-26 ENCOUNTER — Other Ambulatory Visit: Payer: Self-pay | Admitting: Cardiovascular Disease

## 2016-06-26 MED ORDER — CLOPIDOGREL BISULFATE 75 MG PO TABS: 75.0000 mg | ORAL_TABLET | Freq: Every day | ORAL | 1 refills | Status: DC

## 2016-06-26 MED ORDER — CLOPIDOGREL BISULFATE 75 MG PO TABS: 75.0000 mg | ORAL_TABLET | Freq: Every day | ORAL | 0 refills | Status: DC

## 2016-06-26 NOTE — Addendum Note (Signed)
Addended by: Derl Barrow on: 06/26/2016 10:53 AM   Modules accepted: Orders

## 2016-06-26 NOTE — Telephone Encounter (Signed)
Pt's Rx was sent to pt's pharmacy as requested. Confirmation received.  °

## 2016-06-26 NOTE — Telephone Encounter (Signed)
New Message     *STAT* If patient is at the pharmacy, call can be transferred to refill team.   1. Which medications need to be refilled? (please list name of each medication and dose if known) Plavix  2. Which pharmacy/location (including street and city if local pharmacy) is medication to be sent to? [facility] on [address]  3. Do they need a 30 day or 90 day supply? 60  Only has 4 days left

## 2016-06-26 NOTE — Telephone Encounter (Signed)
Follow up   Pt wants 90 day supply

## 2016-07-23 IMAGING — NM NM MYOCAR MULTI W/SPECT W/WALL MOTION & EF
1 series · 6 of 6 positions shown · non-contrast
Comparison: None.

CLINICAL DATA: Chest pain, CT

EXAM:
MYOCARDIAL IMAGING WITH SPECT (REST AND PHARMACOLOGIC-STRESS)
GATED LEFT VENTRICULAR WALL MOTION STUDY
LEFT VENTRICULAR EJECTION FRACTION
TECHNIQUE: Standard myocardial SPECT imaging was performed after resting
intravenous injection of 10 mCi Dc-IIm sestamibi. Subsequently,
intravenous infusion of Lexiscan was performed under the supervision
of the Cardiology staff. At peak effect of the drug, 30 mCi Dc-IIm
sestamibi was injected intravenously and standard myocardial SPECT
imaging was performed. Quantitative gated imaging was also performed
to evaluate left ventricular wall motion, and estimate left
ventricular ejection fraction.

[Series 1: stress gated - (id) _(id)_sa · 8.3mm · 8.28mm/px · 6 of 512 frames shown]
[frame 43/512]
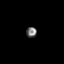
[frame 128/512]
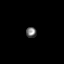
[frame 214/512]
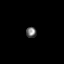
[frame 299/512]
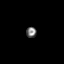
[frame 384/512]
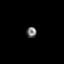
[frame 470/512]
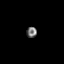

[6 of 6 positions shown; findings below may reference images not displayed]

FINDINGS: Perfusion: No decreased activity in the left ventricle on stress
imaging to suggest reversible ischemia or infarction.

Wall Motion: Normal left ventricular wall motion. No left
ventricular dilation.

Left Ventricular Ejection Fraction: 69 %

End diastolic volume 77 ml

End systolic volume 24 ml
IMPRESSION: 1. No reversible ischemia or infarction.

2. Normal left ventricular wall motion.

3. Left ventricular ejection fraction 69%

4. Low-risk stress test findings*.

*5855 Appropriate Use Criteria for Coronary Revascularization
Focused Update: J Am Coll Cardiol. 5855;59(9):857-881.
[URL]

## 2016-08-14 DIAGNOSIS — I119 Hypertensive heart disease without heart failure: Secondary | ICD-10-CM | POA: Diagnosis not present

## 2016-08-14 DIAGNOSIS — E785 Hyperlipidemia, unspecified: Secondary | ICD-10-CM | POA: Diagnosis not present

## 2016-08-14 DIAGNOSIS — S43421S Sprain of right rotator cuff capsule, sequela: Secondary | ICD-10-CM | POA: Diagnosis not present

## 2016-08-14 DIAGNOSIS — N3020 Other chronic cystitis without hematuria: Secondary | ICD-10-CM | POA: Diagnosis not present

## 2016-08-22 ENCOUNTER — Encounter (INDEPENDENT_AMBULATORY_CARE_PROVIDER_SITE_OTHER): Payer: Self-pay

## 2016-08-22 ENCOUNTER — Ambulatory Visit (INDEPENDENT_AMBULATORY_CARE_PROVIDER_SITE_OTHER): Payer: BLUE CROSS/BLUE SHIELD | Admitting: Cardiovascular Disease

## 2016-08-22 ENCOUNTER — Encounter: Payer: Self-pay | Admitting: Cardiovascular Disease

## 2016-08-22 VITALS — BP 124/78 | HR 56 | Ht 72.0 in | Wt 231.4 lb

## 2016-08-22 DIAGNOSIS — F17201 Nicotine dependence, unspecified, in remission: Secondary | ICD-10-CM | POA: Diagnosis not present

## 2016-08-22 DIAGNOSIS — I739 Peripheral vascular disease, unspecified: Secondary | ICD-10-CM | POA: Diagnosis not present

## 2016-08-22 DIAGNOSIS — E7800 Pure hypercholesterolemia, unspecified: Secondary | ICD-10-CM | POA: Diagnosis not present

## 2016-08-22 DIAGNOSIS — I2510 Atherosclerotic heart disease of native coronary artery without angina pectoris: Secondary | ICD-10-CM | POA: Diagnosis not present

## 2016-08-22 DIAGNOSIS — I10 Essential (primary) hypertension: Secondary | ICD-10-CM

## 2016-08-22 MED ORDER — CLOPIDOGREL BISULFATE 75 MG PO TABS: 75.0000 mg | ORAL_TABLET | Freq: Every day | ORAL | 11 refills | Status: DC

## 2016-08-22 MED ORDER — ATORVASTATIN CALCIUM 20 MG PO TABS: ORAL_TABLET | ORAL | 11 refills | Status: DC

## 2016-08-22 NOTE — Patient Instructions (Signed)
Medication Instructions:  Your physician recommends that you continue on your current medications as directed. Please refer to the Current Medication list given to you today.   Labwork: none  Testing/Procedures: Your physician has requested that you have an ankle brachial index (ABI). During this test an ultrasound and blood pressure cuff are used to evaluate the arteries that supply the arms and legs with blood. Allow thirty minutes for this exam. There are no restrictions or special instructions. Please schedule in 60 minute lower extremity arterial time slot  Follow-Up: Your physician recommends that you schedule a follow-up appointment in: 12 months. Please call our office in about 9 months to schedule this appointment.     Any Other Special Instructions Will Be Listed Below (If Applicable).     If you need a refill on your cardiac medications before your next appointment, please call your pharmacy.

## 2016-08-22 NOTE — Progress Notes (Signed)
Chief Complaint  Patient presents with  . Follow-up    History of Present Illness: 61 yo male with history of CAD s/p PCI/stent followed by 4V CABG (LIMA to LAD, SVG to OM1, SVG to Ramus, SVG to PDA) October 2014 at [facility], PAD, HTN, COPD, tobacco abuse, HLD, sleep apnea here today for cardiac follow up. I saw him as a new patient September 2015 to establish cardiology care. He has been seen in our [place] office in April 2012 by Dr. Rockey Situ and had been followed after that by Dr. Saralyn Pilar in [place]. Admitted to [facility] October 2014 with chest pain and underwent 4V CABG. Also has COPD and OSA. He is known to have PAD with occluded right SFA. Lower extremity dopplers in our office 02/01/14 with normal ABI on the left and slight reduction of ABI (0.92) on the right with documentation of occluded right SFA. Admitted to [facility] January 2016 with chest pain. Troponin negative. Stress myoview 05/19/14 with no ischemia. Lisinopril and metoprolol were stopped due to dizziness.    He is here today for follow up. The patient denies any chest pain, palpitations, lower extremity edema, orthopnea, PND, dizziness, near syncope or syncope. He does have occasional dyspnea with exertion but overall unchanged. He has right leg pain with walking. No rest pain or ulcerations.      Primary Care Physician: Cletis Athens, MD   Past Medical History:  Diagnosis Date  . Anxiety and depression    a. severe - seeing psychiatry.  Marland Kitchen CAD (coronary artery disease)    a. s/p 4V CABG 2014  b. 05/2014 MV: No ischemia, EF 69%.  . Chronic prostatitis   . COPD (chronic obstructive pulmonary disease) ([place])    ongoing tobacco use  . Hyperlipidemia   . Hypertension    a. renal arteries patent on remote cath.  . Obesity   . OSA (obstructive sleep apnea)    severe  . PAD (peripheral artery disease) ([place])    a. 01/2014 ABIs R: 0.92, L 1.05.  Know total occlusion of right SFA with distal reconstitution-->Med Rx.  . Tobacco abuse    a. 05/2014 down to 3-5 cigarettes day and vaping also.    Past Surgical History:  Procedure Laterality Date  . CHOLECYSTECTOMY    . CORONARY ARTERY BYPASS GRAFT  October 2014   4V  . CYSTOSCOPY    . KNEE ARTHROSCOPY     right meniscal tear    Current Outpatient Prescriptions  Medication Sig Dispense Refill  . ALPRAZolam (XANAX) 1 MG tablet Take 0.05 mg by mouth 3 (three) times daily as needed for anxiety.     Marland Kitchen atorvastatin (LIPITOR) 20 MG tablet Take one tablet by mouth 3 days per week 15 tablet 11  . clopidogrel (PLAVIX) 75 MG tablet Take 1 tablet (75 mg total) by mouth daily. 30 tablet 11  . nitroGLYCERIN (NITROSTAT) 0.4 MG SL tablet Place 1 tablet (0.4 mg total) under the tongue every 5 (five) minutes as needed for chest pain. 25 tablet 6  . sertraline (ZOLOFT) 100 MG tablet TAKE 1 1/2 TABLETS BY MOUTH EVERY DAY 45 tablet 0   No current facility-administered medications for this visit.     Allergies  Allergen Reactions  . Sulfa Antibiotics Diarrhea  . Relafen [Nabumetone] Other (See Comments)    Pt doesn't remember     Social History   Social History  . Marital status: Married    Spouse name: N/A  . Number of children: N/A  .  Years of education: N/A   Occupational History  . Not on file.   Social History Main Topics  . Smoking status: Former Smoker    Packs/day: 1.00    Years: 40.00    Types: Cigarettes    Quit date: 08/11/2014  . Smokeless tobacco: Never Used  . Alcohol use No  . Drug use: No  . Sexual activity: Not on file   Other Topics Concern  . Not on file   Social History Narrative  . No narrative on file    Family History  Problem Relation Age of Onset  . Hypertension Mother   . Cataracts Mother   . Diabetes Father     Review of Systems:  As stated in the HPI and otherwise negative.   BP 124/78   Pulse (!) 56   Ht 6' (1.829 m)   Wt 231 lb 6.4 oz (105 kg)   SpO2 95%   BMI 31.38 kg/m   Physical Examination: General: Well developed,  well nourished, NAD  HEENT: OP clear, mucus membranes moist  SKIN: warm, dry. No rashes. Neuro: No focal deficits  Musculoskeletal: Muscle strength 5/5 all ext  Psychiatric: Mood and affect normal  Neck: No JVD, no carotid bruits, no thyromegaly, no lymphadenopathy.  Lungs:Clear bilaterally, no wheezes, rhonci, crackles Cardiovascular: Regular rate and rhythm. No murmurs, gallops or rubs. Abdomen:Soft. Bowel sounds present. Non-tender.  Extremities: No lower extremity edema. Pulses are trace to 1+ in the bilateral DP/PT.  EKG:  EKG is ordered today. The ekg ordered today demonstrates sinus brady, rate 56 bpm. Incomplete RBBB  Recent Labs: 10/26/2015: ALT 16   Lipid Panel    Component Value Date/Time   CHOL 153 10/26/2015 0744   CHOL 100 03/16/2014 1325   TRIG 65 10/26/2015 0744   TRIG 114 03/16/2014 1325   HDL 51 10/26/2015 0744   HDL 31 (L) 03/16/2014 1325   CHOLHDL 3.0 10/26/2015 0744   VLDL 13 10/26/2015 0744   VLDL 23 03/16/2014 1325   LDLCALC 89 10/26/2015 0744   LDLCALC 46 03/16/2014 1325   LDLDIRECT 122.5 12/12/2008 0840     Wt Readings from Last 3 Encounters:  08/22/16 231 lb 6.4 oz (105 kg)  08/03/15 218 lb (98.9 kg)  01/19/15 215 lb 14.4 oz (97.9 kg)     Other studies Reviewed: Additional studies/ records that were reviewed today include: . Review of the above records demonstrates:    Assessment and Plan:   1. CAD without angina: He is having no chest pain suggestive of angina. He is s/p 4V CABG in October 2014 at [facility].Stress myoview January 2016 without ischemia. Will continue Plavix, statin.    2. HTN: His BP is in a normal range today on no anti-hypertensive medications.    3. HLD: Lipids are followed in primary care and well controlled per pt. Will continue statin.    4. Tobacco abuse, in remission: He has stopped smoking.   5. PAD: Known to have occluded right SFA. ABI 02/01/14 slightly decreased on right, normal on left.  He has pain in his right  leg with walking, maybe slightly worse. Will repeat ABI now.  Current medicines are reviewed at length with the patient today.  The patient does not have concerns regarding medicines.  The following changes have been made:  no change  Labs/ tests ordered today include:   Orders Placed This Encounter  Procedures  . EKG 12-Lead    Disposition:   FU with me in 12 months  Signed, Lauree Chandler, MD 08/22/2016 12:58 PM    [facility] [place], [place], [place]  [address] Phone: [phone]; Fax: [phone]

## 2016-09-12 ENCOUNTER — Ambulatory Visit (HOSPITAL_COMMUNITY)
Admission: RE | Admit: 2016-09-12 | Discharge: 2016-09-12 | Disposition: A | Discharge status: Home / Self Care | Payer: BLUE CROSS/BLUE SHIELD | Source: Ambulatory Visit | Attending: Cardiovascular Disease | Admitting: Cardiovascular Disease

## 2016-09-12 DIAGNOSIS — I739 Peripheral vascular disease, unspecified: Secondary | ICD-10-CM | POA: Insufficient documentation

## 2016-11-28 ENCOUNTER — Other Ambulatory Visit: Payer: Self-pay | Admitting: Cardiovascular Disease

## 2017-03-03 DIAGNOSIS — Z23 Encounter for immunization: Secondary | ICD-10-CM | POA: Diagnosis not present

## 2017-06-01 DIAGNOSIS — G4730 Sleep apnea, unspecified: Secondary | ICD-10-CM | POA: Diagnosis not present

## 2017-06-01 DIAGNOSIS — N3010 Interstitial cystitis (chronic) without hematuria: Secondary | ICD-10-CM | POA: Diagnosis not present

## 2017-06-01 DIAGNOSIS — N390 Urinary tract infection, site not specified: Secondary | ICD-10-CM | POA: Diagnosis not present

## 2017-06-01 DIAGNOSIS — R635 Abnormal weight gain: Secondary | ICD-10-CM | POA: Diagnosis not present

## 2017-06-02 DIAGNOSIS — E7849 Other hyperlipidemia: Secondary | ICD-10-CM | POA: Diagnosis not present

## 2017-06-02 DIAGNOSIS — R5381 Other malaise: Secondary | ICD-10-CM | POA: Diagnosis not present

## 2017-06-02 DIAGNOSIS — I10 Essential (primary) hypertension: Secondary | ICD-10-CM | POA: Diagnosis not present

## 2017-06-08 DIAGNOSIS — G4730 Sleep apnea, unspecified: Secondary | ICD-10-CM | POA: Diagnosis not present

## 2017-06-08 DIAGNOSIS — N419 Inflammatory disease of prostate, unspecified: Secondary | ICD-10-CM | POA: Diagnosis not present

## 2017-06-08 DIAGNOSIS — I25810 Atherosclerosis of coronary artery bypass graft(s) without angina pectoris: Secondary | ICD-10-CM | POA: Diagnosis not present

## 2017-06-08 DIAGNOSIS — N3020 Other chronic cystitis without hematuria: Secondary | ICD-10-CM | POA: Diagnosis not present

## 2017-06-11 DIAGNOSIS — R001 Bradycardia, unspecified: Secondary | ICD-10-CM | POA: Diagnosis not present

## 2017-06-11 DIAGNOSIS — I25810 Atherosclerosis of coronary artery bypass graft(s) without angina pectoris: Secondary | ICD-10-CM | POA: Diagnosis not present

## 2017-06-11 DIAGNOSIS — N419 Inflammatory disease of prostate, unspecified: Secondary | ICD-10-CM | POA: Diagnosis not present

## 2017-06-11 DIAGNOSIS — G4730 Sleep apnea, unspecified: Secondary | ICD-10-CM | POA: Diagnosis not present

## 2017-06-11 DIAGNOSIS — I119 Hypertensive heart disease without heart failure: Secondary | ICD-10-CM | POA: Diagnosis not present

## 2017-06-15 ENCOUNTER — Other Ambulatory Visit: Payer: Self-pay

## 2017-06-24 ENCOUNTER — Other Ambulatory Visit: Payer: Self-pay

## 2017-07-02 ENCOUNTER — Ambulatory Visit: Payer: BLUE CROSS/BLUE SHIELD

## 2017-09-04 ENCOUNTER — Other Ambulatory Visit: Payer: Self-pay | Admitting: Cardiovascular Disease

## 2017-10-09 DIAGNOSIS — I25810 Atherosclerosis of coronary artery bypass graft(s) without angina pectoris: Secondary | ICD-10-CM | POA: Diagnosis not present

## 2017-10-09 DIAGNOSIS — I119 Hypertensive heart disease without heart failure: Secondary | ICD-10-CM | POA: Diagnosis not present

## 2017-10-09 DIAGNOSIS — R001 Bradycardia, unspecified: Secondary | ICD-10-CM | POA: Diagnosis not present

## 2017-10-09 DIAGNOSIS — G4730 Sleep apnea, unspecified: Secondary | ICD-10-CM | POA: Diagnosis not present

## 2017-10-16 DIAGNOSIS — I10 Essential (primary) hypertension: Secondary | ICD-10-CM | POA: Diagnosis not present

## 2017-10-16 DIAGNOSIS — R5381 Other malaise: Secondary | ICD-10-CM | POA: Diagnosis not present

## 2017-10-16 DIAGNOSIS — E7849 Other hyperlipidemia: Secondary | ICD-10-CM | POA: Diagnosis not present

## 2017-10-23 DIAGNOSIS — N400 Enlarged prostate without lower urinary tract symptoms: Secondary | ICD-10-CM | POA: Diagnosis not present

## 2017-10-23 DIAGNOSIS — I25810 Atherosclerosis of coronary artery bypass graft(s) without angina pectoris: Secondary | ICD-10-CM | POA: Diagnosis not present

## 2017-10-23 DIAGNOSIS — R55 Syncope and collapse: Secondary | ICD-10-CM | POA: Diagnosis not present

## 2017-10-23 DIAGNOSIS — N3020 Other chronic cystitis without hematuria: Secondary | ICD-10-CM | POA: Diagnosis not present

## 2017-10-23 DIAGNOSIS — I453 Trifascicular block: Secondary | ICD-10-CM | POA: Diagnosis not present

## 2017-11-27 DIAGNOSIS — I453 Trifascicular block: Secondary | ICD-10-CM | POA: Diagnosis not present

## 2017-11-27 DIAGNOSIS — N400 Enlarged prostate without lower urinary tract symptoms: Secondary | ICD-10-CM | POA: Diagnosis not present

## 2017-11-27 DIAGNOSIS — I25810 Atherosclerosis of coronary artery bypass graft(s) without angina pectoris: Secondary | ICD-10-CM | POA: Diagnosis not present

## 2017-11-27 DIAGNOSIS — N3020 Other chronic cystitis without hematuria: Secondary | ICD-10-CM | POA: Diagnosis not present

## 2017-12-11 DIAGNOSIS — N400 Enlarged prostate without lower urinary tract symptoms: Secondary | ICD-10-CM | POA: Diagnosis not present

## 2017-12-11 DIAGNOSIS — I453 Trifascicular block: Secondary | ICD-10-CM | POA: Diagnosis not present

## 2017-12-11 DIAGNOSIS — I119 Hypertensive heart disease without heart failure: Secondary | ICD-10-CM | POA: Diagnosis not present

## 2017-12-11 DIAGNOSIS — Z0000 Encounter for general adult medical examination without abnormal findings: Secondary | ICD-10-CM | POA: Diagnosis not present

## 2017-12-11 DIAGNOSIS — R001 Bradycardia, unspecified: Secondary | ICD-10-CM | POA: Diagnosis not present

## 2017-12-21 ENCOUNTER — Ambulatory Visit (INDEPENDENT_AMBULATORY_CARE_PROVIDER_SITE_OTHER): Payer: BLUE CROSS/BLUE SHIELD | Admitting: Urology

## 2017-12-21 ENCOUNTER — Encounter: Payer: Self-pay | Admitting: Urology

## 2017-12-21 VITALS — BP 156/80 | HR 64 | Ht 71.0 in | Wt 249.8 lb

## 2017-12-21 DIAGNOSIS — N3010 Interstitial cystitis (chronic) without hematuria: Secondary | ICD-10-CM | POA: Diagnosis not present

## 2017-12-21 LAB — URINALYSIS, COMPLETE
Bilirubin, UA: NEGATIVE
GLUCOSE, UA: NEGATIVE
KETONES UA: NEGATIVE
Leukocytes, UA: NEGATIVE
Nitrite, UA: NEGATIVE
UUROB: 0.2 mg/dL (ref 0.2–1.0)
pH, UA: 5.5 (ref 5.0–7.5)

## 2017-12-21 LAB — MICROSCOPIC EXAMINATION
Epithelial Cells (non renal): NONE SEEN /hpf (ref 0–10)
RBC MICROSCOPIC, UA: NONE SEEN /HPF (ref 0–2)

## 2017-12-21 MED ORDER — TAMSULOSIN HCL 0.4 MG PO CAPS: 0.4000 mg | ORAL_CAPSULE | Freq: Every day | ORAL | 1 refills | Status: DC

## 2017-12-21 NOTE — Progress Notes (Signed)
12/21/2017 3:08 PM   Richard Burton Sep 21, 1955 [id number]  Referring provider: Cletis Athens, MD [address] [place], [place] [address]  Chief Complaint  Patient presents with  . Prostatitis    HPI: 62 year old male who was previous followed by Dr. Gaynelle Arabian for many years.  He states he was diagnosed with chronic prostatitis and interstitial cystitis at approximately age 57.  When MRI and was approved he took this for several years which was effective however his insurance company stopped covering this medication and he had to discontinue.  He last saw Dr. Gaynelle Arabian approximately 2 to 3 years ago.  He recently began to have increased symptoms of a low back pain, pelvic pain and urinary frequency, urgency, intermittent urinary stream and postvoid dribbling.  He does not think he was ever on tamsulosin.  His records were not available for review.  He denies dysuria or gross hematuria.   PMH: Past Medical History:  Diagnosis Date  . Anxiety and depression    a. severe - seeing psychiatry.  Marland Kitchen CAD (coronary artery disease)    a. s/p 4V CABG 2014  b. 05/2014 MV: No ischemia, EF 69%.  . Chronic prostatitis   . COPD (chronic obstructive pulmonary disease) ([place])    ongoing tobacco use  . GERD (gastroesophageal reflux disease)   . History of DVT (deep vein thrombosis)   . Hyperlipidemia   . Hyperlipidemia   . Hypertension    a. renal arteries patent on remote cath.  . Obesity   . OSA (obstructive sleep apnea)    severe  . PAD (peripheral artery disease) ([place])    a. 01/2014 ABIs R: 0.92, L 1.05.  Know total occlusion of right SFA with distal reconstitution-->Med Rx.  . Sleep apnea   . Tobacco abuse    a. 05/2014 down to 3-5 cigarettes day and vaping also.    Surgical History: Past Surgical History:  Procedure Laterality Date  . CHOLECYSTECTOMY    . CORONARY ARTERY BYPASS GRAFT  October 2014   4V  . CYSTOSCOPY    . KNEE ARTHROSCOPY     right meniscal tear    Home  Medications:  Allergies as of 12/21/2017      Reactions   Sulfa Antibiotics Diarrhea   Relafen [nabumetone] Other (See Comments)   Pt doesn't remember       Medication List        Accurate as of 12/21/17  3:08 PM. Always use your most recent med list.          ALPRAZolam 1 MG tablet Commonly known as:  XANAX Take 0.05 mg by mouth 3 (three) times daily as needed for anxiety.   aspirin EC 81 MG tablet Take by mouth.   atorvastatin 40 MG tablet Commonly known as:  LIPITOR Take 40 mg by mouth daily.   clopidogrel 75 MG tablet Commonly known as:  PLAVIX Take 1 tablet (75 mg total) by mouth daily. Patient needs to call and schedule an appointment for further refills 1st attempt   losartan 25 MG tablet Commonly known as:  COZAAR Take 25 mg by mouth daily.   nitroGLYCERIN 0.4 MG SL tablet Commonly known as:  NITROSTAT Place 1 tablet (0.4 mg total) under the tongue every 5 (five) minutes as needed for chest pain.   pantoprazole 40 MG tablet Commonly known as:  PROTONIX Take 40 mg by mouth daily.   sertraline 100 MG tablet Commonly known as:  ZOLOFT TAKE 1 1/2 TABLETS BY MOUTH EVERY  DAY       Allergies:  Allergies  Allergen Reactions  . Sulfa Antibiotics Diarrhea  . Relafen [Nabumetone] Other (See Comments)    Pt doesn't remember     Family History: Family History  Problem Relation Age of Onset  . Hypertension Mother   . Cataracts Mother   . Diabetes Father     Social History:  reports that he quit smoking about 3 years ago. His smoking use included cigarettes. He has a 40.00 pack-year smoking history. He has never used smokeless tobacco. He reports that he does not drink alcohol or use drugs.  ROS: UROLOGY Frequent Urination?: Yes Hard to postpone urination?: No Burning/pain with urination?: Yes Get up at night to urinate?: Yes Leakage of urine?: Yes Urine stream starts and stops?: Yes Trouble starting stream?: No Do you have to strain to urinate?:  No Blood in urine?: No Urinary tract infection?: No Sexually transmitted disease?: No Injury to kidneys or bladder?: No Painful intercourse?: No Weak stream?: Yes Erection problems?: No Penile pain?: Yes  Gastrointestinal Nausea?: Yes Vomiting?: No Indigestion/heartburn?: Yes Diarrhea?: Yes Constipation?: No  Constitutional Fever: Yes Night sweats?: No Weight loss?: No Fatigue?: Yes  Skin Skin rash/lesions?: No Itching?: Yes  Eyes Blurred vision?: No Double vision?: No  Ears/Nose/Throat Sore throat?: No Sinus problems?: Yes  Hematologic/Lymphatic Swollen glands?: No Easy bruising?: Yes  Cardiovascular Leg swelling?: No Chest pain?: No  Respiratory Cough?: No Shortness of breath?: Yes  Endocrine Excessive thirst?: Yes  Musculoskeletal Back pain?: Yes Joint pain?: Yes  Neurological Headaches?: Yes Dizziness?: Yes  Psychologic Depression?: Yes Anxiety?: Yes  Physical Exam: BP (!) 156/80 (BP Location: Left Arm, Patient Position: Sitting, Cuff Size: Large)   Pulse 64   Ht 5\' 11"  (1.803 m)   Wt 249 lb 12.8 oz (113.3 kg)   BMI 34.84 kg/m   Constitutional:  Alert and oriented, No acute distress. HEENT: [place] AT, moist mucus membranes.  Trachea midline, no masses. Cardiovascular: No clubbing, cyanosis, or edema. Respiratory: Normal respiratory effort, no increased work of breathing. GI: Abdomen is soft, nontender, nondistended, no abdominal masses GU: No CVA tenderness.  Prostate 40 g, smooth without nodules. Lymph: No cervical or inguinal lymphadenopathy. Skin: No rashes, bruises or suspicious lesions. Neurologic: Grossly intact, no focal deficits, moving all 4 extremities. Psychiatric: Normal mood and affect.  Laboratory Data:  Lab Results  Component Value Date   PSA 0.68 12/12/2008   PSA 0.74 12/06/2007   PSA 0.48 02/04/2006    Assessment & Plan:   62 year old male with history of interstitial cystitis and chronic prostatitis.  Will  request his [facility] records for review.  Rx tamsulosin was sent to his pharmacy.  Follow-up in approximately 6 weeks.  Abbie Sons, [place] [address] [place], [place] [address] [phone]

## 2017-12-23 ENCOUNTER — Encounter: Payer: Self-pay | Admitting: Urology

## 2017-12-31 ENCOUNTER — Ambulatory Visit (INDEPENDENT_AMBULATORY_CARE_PROVIDER_SITE_OTHER): Payer: BLUE CROSS/BLUE SHIELD | Admitting: Cardiovascular Disease

## 2017-12-31 ENCOUNTER — Encounter: Payer: Self-pay | Admitting: *Deleted

## 2017-12-31 ENCOUNTER — Encounter: Payer: Self-pay | Admitting: Cardiovascular Disease

## 2017-12-31 VITALS — BP 130/90 | HR 60 | Ht 71.0 in | Wt 248.0 lb

## 2017-12-31 DIAGNOSIS — I739 Peripheral vascular disease, unspecified: Secondary | ICD-10-CM

## 2017-12-31 DIAGNOSIS — F17201 Nicotine dependence, unspecified, in remission: Secondary | ICD-10-CM

## 2017-12-31 DIAGNOSIS — E7800 Pure hypercholesterolemia, unspecified: Secondary | ICD-10-CM | POA: Diagnosis not present

## 2017-12-31 DIAGNOSIS — I10 Essential (primary) hypertension: Secondary | ICD-10-CM

## 2017-12-31 DIAGNOSIS — I25118 Atherosclerotic heart disease of native coronary artery with other forms of angina pectoris: Secondary | ICD-10-CM

## 2017-12-31 MED ORDER — ATORVASTATIN CALCIUM 80 MG PO TABS: 80.0000 mg | ORAL_TABLET | Freq: Every day | ORAL | 3 refills | Status: AC

## 2017-12-31 NOTE — Progress Notes (Signed)
Chief Complaint  Patient presents with  . Follow-up    CAD    History of Present Illness: 62 yo male with history of CAD s/p PCI/stent followed by 4V CABG (LIMA to LAD, SVG to OM1, SVG to Ramus, SVG to PDA) October 2014 at [facility], PAD, HTN, COPD, tobacco abuse, HLD, sleep apnea here today for cardiac follow up. I saw him as a new patient September 2015 to establish cardiology care. He has been seen in our [place] office in April 2012 by Dr. Rockey Situ and had been followed after that by Dr. Saralyn Pilar in [place]. Admitted to [facility] October 2014 with chest pain and underwent 4V CABG. Also has COPD and OSA. He is known to have PAD with occluded right SFA. Lower extremity dopplers in our office 02/01/14 with normal ABI on the left and slight reduction of ABI (0.92) on the right with documentation of occluded right SFA. Admitted to [facility] January 2016 with chest pain. Troponin negative. Stress myoview 05/19/14 with no ischemia. When I saw him in 2018 he c/o right leg pain. ABI was 0.92 on the right and 1.1 on the left. We discussed referral to [facility] clinic but he wished to try more walking first.   He is here today for follow up. The patient denies any palpitations, lower extremity edema, orthopnea, PND, near syncope or syncope. He describes episodes of dizziness. He has some chest pain but "nothing major" per pt. He has some dyspnea. He has woken up at night a few times feeling poorly. He wore a 24 hour monitor in primary care last week but has not heard from the results.   Primary Care Physician: Cletis Athens, MD   Past Medical History:  Diagnosis Date  . Anxiety and depression    a. severe - seeing psychiatry.  Marland Kitchen CAD (coronary artery disease)    a. s/p 4V CABG 2014  b. 05/2014 MV: No ischemia, EF 69%.  . Chronic prostatitis   . COPD (chronic obstructive pulmonary disease) ([place])    ongoing tobacco use  . GERD (gastroesophageal reflux disease)   . History of DVT (deep vein thrombosis)   .  Hyperlipidemia   . Hyperlipidemia   . Hypertension    a. renal arteries patent on remote cath.  . Obesity   . OSA (obstructive sleep apnea)    severe  . PAD (peripheral artery disease) ([place])    a. 01/2014 ABIs R: 0.92, L 1.05.  Know total occlusion of right SFA with distal reconstitution-->Med Rx.  . Sleep apnea   . Tobacco abuse    a. 05/2014 down to 3-5 cigarettes day and vaping also.    Past Surgical History:  Procedure Laterality Date  . CHOLECYSTECTOMY    . CORONARY ARTERY BYPASS GRAFT  October 2014   4V  . CYSTOSCOPY    . KNEE ARTHROSCOPY     right meniscal tear    Current Outpatient Medications  Medication Sig Dispense Refill  . ALPRAZolam (XANAX) 1 MG tablet Take 0.05 mg by mouth 3 (three) times daily as needed for anxiety.     Marland Kitchen aspirin EC 81 MG tablet Take by mouth.    . clopidogrel (PLAVIX) 75 MG tablet Take 1 tablet (75 mg total) by mouth daily. Patient needs to call and schedule an appointment for further refills 1st attempt 30 tablet 0  . losartan (COZAAR) 25 MG tablet Take 25 mg by mouth daily.  7  . nitroGLYCERIN (NITROSTAT) 0.4 MG SL tablet Place 1 tablet (0.4  mg total) under the tongue every 5 (five) minutes as needed for chest pain. 25 tablet 6  . pantoprazole (PROTONIX) 40 MG tablet Take 40 mg by mouth daily.  8  . sertraline (ZOLOFT) 100 MG tablet TAKE 1 1/2 TABLETS BY MOUTH EVERY DAY 45 tablet 0  . tamsulosin (FLOMAX) 0.4 MG CAPS capsule Take 1 capsule (0.4 mg total) by mouth daily. 30 capsule 1  . atorvastatin (LIPITOR) 80 MG tablet Take 1 tablet (80 mg total) by mouth daily. 90 tablet 3   No current facility-administered medications for this visit.     Allergies  Allergen Reactions  . Sulfa Antibiotics Diarrhea  . Relafen [Nabumetone] Other (See Comments)    Pt doesn't remember     Social History   Socioeconomic History  . Marital status: Married    Spouse name: Not on file  . Number of children: Not on file  . Years of education: Not on file    . Highest education level: Not on file  Occupational History  . Not on file  Social Needs  . Financial resource strain: Not on file  . Food insecurity:    Worry: Not on file    Inability: Not on file  . Transportation needs:    Medical: Not on file    Non-medical: Not on file  Tobacco Use  . Smoking status: Former Smoker    Packs/day: 1.00    Years: 40.00    Pack years: 40.00    Types: Cigarettes    Last attempt to quit: 08/11/2014    Years since quitting: 3.3  . Smokeless tobacco: Never Used  Substance and Sexual Activity  . Alcohol use: No    Alcohol/week: 0.0 standard drinks  . Drug use: No  . Sexual activity: Yes    Birth control/protection: None  Lifestyle  . Physical activity:    Days per week: Not on file    Minutes per session: Not on file  . Stress: Not on file  Relationships  . Social connections:    Talks on phone: Not on file    Gets together: Not on file    Attends religious service: Not on file    Active member of club or organization: Not on file    Attends meetings of clubs or organizations: Not on file    Relationship status: Not on file  . Intimate partner violence:    Fear of current or ex partner: Not on file    Emotionally abused: Not on file    Physically abused: Not on file    Forced sexual activity: Not on file  Other Topics Concern  . Not on file  Social History Narrative  . Not on file    Family History  Problem Relation Age of Onset  . Hypertension Mother   . Cataracts Mother   . Diabetes Father     Review of Systems:  As stated in the HPI and otherwise negative.   BP 130/90   Pulse 60   Ht 5\' 11"  (1.803 m)   Wt 248 lb (112.5 kg)   SpO2 96%   BMI 34.59 kg/m   Physical Examination:  General: Well developed, well nourished, NAD  HEENT: OP clear, mucus membranes moist  SKIN: warm, dry. No rashes. Neuro: No focal deficits  Musculoskeletal: Muscle strength 5/5 all ext  Psychiatric: Mood and affect normal  Neck: No JVD,  no carotid bruits, no thyromegaly, no lymphadenopathy.  Lungs:Clear bilaterally, no wheezes, rhonci, crackles Cardiovascular: Regular  rate and rhythm. No murmurs, gallops or rubs. Abdomen:Soft. Bowel sounds present. Non-tender.  Extremities: No lower extremity edema. Pulses are trace in the bilateral DP/PT.  EKG:  EKG is ordered today. The ekg ordered today demonstrates Sinus brady, rate 56 bpm. 1st degree AV block.   Recent Labs: No results found for requested labs within last 8760 hours.   Lipid Panel    Component Value Date/Time   CHOL 153 10/26/2015 0744   CHOL 100 03/16/2014 1325   TRIG 65 10/26/2015 0744   TRIG 114 03/16/2014 1325   HDL 51 10/26/2015 0744   HDL 31 (L) 03/16/2014 1325   CHOLHDL 3.0 10/26/2015 0744   VLDL 13 10/26/2015 0744   VLDL 23 03/16/2014 1325   LDLCALC 89 10/26/2015 0744   LDLCALC 46 03/16/2014 1325   LDLDIRECT 122.5 12/12/2008 0840     Wt Readings from Last 3 Encounters:  12/31/17 248 lb (112.5 kg)  12/21/17 249 lb 12.8 oz (113.3 kg)  08/22/16 231 lb 6.4 oz (105 kg)     Other studies Reviewed: Additional studies/ records that were reviewed today include: . Review of the above records demonstrates:    Assessment and Plan:   1. CAD with stable angina: He has some chest pain and dyspnea. He is s/p 4V CABG in October 2014 at [facility]. Stress myoview January 2016 without ischemia. Given the dyspnea and chest pain, will arrange echo and pharmacological nuclear stress test. Continue ASA, Plavix and statin.   2. HTN: BP is controlled. No changes.   3. HLD: Lipids are followed in primary care. Last LDL 132 in June 2019. Will increase Lipitor to 80 mg daily and repeat lipids and LFTS in 12 weeks.   4. Tobacco abuse, in remission: He has stopped smoking.   5. PAD: Known to have occluded right SFA. ABI slightly decreased on right in 2018. He did not wish to pursue referral to the [facility] at that time. He will call with change in symptoms.   Current  medicines are reviewed at length with the patient today.  The patient does not have concerns regarding medicines.  The following changes have been made:  no change  Labs/ tests ordered today include:   Orders Placed This Encounter  Procedures  . Lipid Profile  . Hepatic function panel  . MYOCARDIAL PERFUSION IMAGING  . EKG 12-Lead  . ECHOCARDIOGRAM COMPLETE    Disposition:   FU with me in 12 months  Signed, Lauree Chandler, MD 12/31/2017 3:23 PM    [facility] [place], [place], [place]  [address] Phone: [phone]; Fax: [phone]

## 2017-12-31 NOTE — Patient Instructions (Signed)
Medication Instructions:  Your physician has recommended you make the following change in your medication:  Increase atorvastatin to 80 mg by mouth daily.    Labwork: Your physician recommends that you return for lab work in: about 12 weeks.  Lipid and liver profiles.  This will be fasting.    Testing/Procedures: Your physician has requested that you have an echocardiogram. Echocardiography is a painless test that uses sound waves to create images of your heart. It provides your doctor with information about the size and shape of your heart and how well your heart's chambers and valves are working. This procedure takes approximately one hour. There are no restrictions for this procedure.  Your physician has requested that you have a lexiscan myoview. For further information please visit [email or web address]. Please follow instruction sheet, as given.    Follow-Up: Your physician recommends that you schedule a follow-up appointment in: about 6 weeks.  Scheduled for October 7,2019 at 8:40   Any Other Special Instructions Will Be Listed Below (If Applicable).     If you need a refill on your cardiac medications before your next appointment, please call your pharmacy.

## 2018-01-05 ENCOUNTER — Telehealth (HOSPITAL_COMMUNITY): Payer: Self-pay | Admitting: *Deleted

## 2018-01-05 NOTE — Telephone Encounter (Signed)
Patient given detailed instructions per Myocardial Perfusion Study Information Sheet for the test on 01/08/18. Patient notified to arrive 15 minutes early and that it is imperative to arrive on time for appointment to keep from having the test rescheduled.  If you need to cancel or reschedule your appointment, please call the office within 24 hours of your appointment. . Patient verbalized understanding. Kirstie Peri

## 2018-01-06 ENCOUNTER — Other Ambulatory Visit: Payer: Self-pay | Admitting: Cardiovascular Disease

## 2018-01-06 MED ORDER — CLOPIDOGREL BISULFATE 75 MG PO TABS: 75.0000 mg | ORAL_TABLET | Freq: Every day | ORAL | 3 refills | Status: DC

## 2018-01-06 MED ORDER — NITROGLYCERIN 0.4 MG SL SUBL: 0.4000 mg | SUBLINGUAL_TABLET | SUBLINGUAL | 6 refills | Status: DC | PRN

## 2018-01-06 NOTE — Telephone Encounter (Signed)
°*  STAT* If patient is at the pharmacy, call can be transferred to refill team.   1. Which medications need to be refilled? (please list name of each medication and dose if known) Plavix Nitro taps   2. Which pharmacy/location (including street and city if local pharmacy) is medication to be sent to? [facility]  3. Do they need a 30 day or 90 day supply? 30    Patient had a appt last Friday for a refill but only filled the one he really do not need right  Just need Plavix Nitro only had one left

## 2018-01-06 NOTE — Telephone Encounter (Signed)
Pt's medication was sent to pt's pharmacy as requested. Confirmation received.  °

## 2018-01-08 ENCOUNTER — Other Ambulatory Visit: Payer: Self-pay

## 2018-01-08 ENCOUNTER — Ambulatory Visit (HOSPITAL_BASED_OUTPATIENT_CLINIC_OR_DEPARTMENT_OTHER): Payer: BLUE CROSS/BLUE SHIELD

## 2018-01-08 ENCOUNTER — Ambulatory Visit (HOSPITAL_COMMUNITY): Payer: BLUE CROSS/BLUE SHIELD | Attending: Cardiovascular Disease

## 2018-01-08 DIAGNOSIS — Z6834 Body mass index (BMI) 34.0-34.9, adult: Secondary | ICD-10-CM | POA: Insufficient documentation

## 2018-01-08 DIAGNOSIS — E785 Hyperlipidemia, unspecified: Secondary | ICD-10-CM | POA: Insufficient documentation

## 2018-01-08 DIAGNOSIS — I25118 Atherosclerotic heart disease of native coronary artery with other forms of angina pectoris: Secondary | ICD-10-CM | POA: Diagnosis not present

## 2018-01-08 DIAGNOSIS — Z951 Presence of aortocoronary bypass graft: Secondary | ICD-10-CM | POA: Diagnosis not present

## 2018-01-08 DIAGNOSIS — Z87891 Personal history of nicotine dependence: Secondary | ICD-10-CM | POA: Diagnosis not present

## 2018-01-08 DIAGNOSIS — J449 Chronic obstructive pulmonary disease, unspecified: Secondary | ICD-10-CM | POA: Diagnosis not present

## 2018-01-08 DIAGNOSIS — E669 Obesity, unspecified: Secondary | ICD-10-CM | POA: Diagnosis not present

## 2018-01-08 DIAGNOSIS — I119 Hypertensive heart disease without heart failure: Secondary | ICD-10-CM | POA: Diagnosis not present

## 2018-01-08 LAB — MYOCARDIAL PERFUSION IMAGING
CHL CUP NUCLEAR SRS: 0
CHL CUP RESTING HR STRESS: 46 {beats}/min
LVDIAVOL: 97 mL (ref 62–150)
LVSYSVOL: 35 mL
Peak HR: 66 {beats}/min
SDS: 0
SSS: 0
TID: 0.98

## 2018-01-08 LAB — ECHOCARDIOGRAM COMPLETE
Height: 71 in
WEIGHTICAEL: 3968 [oz_av]

## 2018-01-08 MED ORDER — REGADENOSON 0.4 MG/5ML IV SOLN
0.4000 mg | Freq: Once | INTRAVENOUS | Status: AC
  Administered 2018-01-08: 0.4 mg via INTRAVENOUS

## 2018-01-08 MED ORDER — TECHNETIUM TC 99M TETROFOSMIN IV KIT
10.2000 | PACK | Freq: Once | INTRAVENOUS | Status: AC | PRN
  Administered 2018-01-08: 10.2 via INTRAVENOUS
  Filled 2018-01-08: qty 11

## 2018-01-08 MED ORDER — PERFLUTREN LIPID MICROSPHERE
1.0000 mL | INTRAVENOUS | Status: AC | PRN
  Administered 2018-01-08: 3 mL via INTRAVENOUS

## 2018-01-08 MED ORDER — TECHNETIUM TC 99M TETROFOSMIN IV KIT
30.6000 | PACK | Freq: Once | INTRAVENOUS | Status: AC | PRN
  Administered 2018-01-08: 30.6 via INTRAVENOUS
  Filled 2018-01-08: qty 31

## 2018-02-03 ENCOUNTER — Ambulatory Visit: Payer: BLUE CROSS/BLUE SHIELD | Admitting: Urology

## 2018-02-11 ENCOUNTER — Ambulatory Visit (INDEPENDENT_AMBULATORY_CARE_PROVIDER_SITE_OTHER): Payer: BLUE CROSS/BLUE SHIELD | Admitting: Podiatry

## 2018-02-11 ENCOUNTER — Encounter: Payer: Self-pay | Admitting: Podiatry

## 2018-02-11 DIAGNOSIS — D689 Coagulation defect, unspecified: Secondary | ICD-10-CM

## 2018-02-11 DIAGNOSIS — M216X2 Other acquired deformities of left foot: Secondary | ICD-10-CM | POA: Diagnosis not present

## 2018-02-11 DIAGNOSIS — Q828 Other specified congenital malformations of skin: Secondary | ICD-10-CM | POA: Diagnosis not present

## 2018-02-11 NOTE — Progress Notes (Signed)
This patient presents the office with chief complaint of a painful skin lesion on the outside of his left foot.  He says this area has been painful for the last 3 to 4 months.  He says he experiences pain primarily when he walks.  He says he has developed a similar lesion on his right foot which was previously removed.  He presents the office today for an evaluation and treatment of the painful skin lesion left foot.    Vascular  Dorsalis pedis and posterior tibial pulses are palpable  B/L.  Capillary return  WNL.  Temperature gradient is  WNL.  Skin turgor  WNL  Sensorium  Senn Weinstein monofilament wire  WNL. Normal tactile sensation.  Nail Exam  Patient has normal nails with no evidence of bacterial or fungal infection.  Orthopedic  Exam  Muscle tone and muscle strength  WNL.  No limitations of motion feet  B/L.  No crepitus or joint effusion noted.  Foot type is unremarkable and digits show no abnormalities.  Bony prominences are unremarkable.  Plantarflexed fifth metatarsal left foot.  Skin  No open lesions.  Normal skin texture and turgor.  Porokeratosis sub-fifth metatarsal left foot.  Diagnosis porokeratosis secondary to plantarflexed fifth metatarsal left foot.  Initial exam debridement of skin lesion.  Discussed this condition with patient.  Recommended Spenco three-quarter orthotics to be worn in his shoes.  RTC prn   Gardiner Barefoot DPM

## 2018-02-15 ENCOUNTER — Ambulatory Visit: Payer: BLUE CROSS/BLUE SHIELD | Admitting: Cardiovascular Disease

## 2018-03-01 DIAGNOSIS — Z23 Encounter for immunization: Secondary | ICD-10-CM | POA: Diagnosis not present

## 2018-03-12 ENCOUNTER — Encounter: Payer: Self-pay | Admitting: Urology

## 2018-03-12 ENCOUNTER — Ambulatory Visit: Payer: BLUE CROSS/BLUE SHIELD | Admitting: Urology

## 2018-07-29 DIAGNOSIS — G4730 Sleep apnea, unspecified: Secondary | ICD-10-CM | POA: Diagnosis not present

## 2018-07-29 DIAGNOSIS — R001 Bradycardia, unspecified: Secondary | ICD-10-CM | POA: Diagnosis not present

## 2018-07-29 DIAGNOSIS — I25810 Atherosclerosis of coronary artery bypass graft(s) without angina pectoris: Secondary | ICD-10-CM | POA: Diagnosis not present

## 2018-07-29 DIAGNOSIS — N3020 Other chronic cystitis without hematuria: Secondary | ICD-10-CM | POA: Diagnosis not present

## 2018-09-24 DIAGNOSIS — I25810 Atherosclerosis of coronary artery bypass graft(s) without angina pectoris: Secondary | ICD-10-CM | POA: Diagnosis not present

## 2018-09-24 DIAGNOSIS — S43421S Sprain of right rotator cuff capsule, sequela: Secondary | ICD-10-CM | POA: Diagnosis not present

## 2018-09-24 DIAGNOSIS — G629 Polyneuropathy, unspecified: Secondary | ICD-10-CM | POA: Diagnosis not present

## 2018-09-24 DIAGNOSIS — G4730 Sleep apnea, unspecified: Secondary | ICD-10-CM | POA: Diagnosis not present

## 2018-09-29 DIAGNOSIS — I10 Essential (primary) hypertension: Secondary | ICD-10-CM | POA: Diagnosis not present

## 2018-09-29 DIAGNOSIS — E7849 Other hyperlipidemia: Secondary | ICD-10-CM | POA: Diagnosis not present

## 2018-09-29 DIAGNOSIS — R5381 Other malaise: Secondary | ICD-10-CM | POA: Diagnosis not present

## 2018-10-15 DIAGNOSIS — E785 Hyperlipidemia, unspecified: Secondary | ICD-10-CM | POA: Diagnosis not present

## 2018-10-15 DIAGNOSIS — G629 Polyneuropathy, unspecified: Secondary | ICD-10-CM | POA: Diagnosis not present

## 2018-10-15 DIAGNOSIS — E119 Type 2 diabetes mellitus without complications: Secondary | ICD-10-CM | POA: Diagnosis not present

## 2018-10-15 DIAGNOSIS — I25810 Atherosclerosis of coronary artery bypass graft(s) without angina pectoris: Secondary | ICD-10-CM | POA: Diagnosis not present

## 2018-10-15 DIAGNOSIS — G4730 Sleep apnea, unspecified: Secondary | ICD-10-CM | POA: Diagnosis not present

## 2018-11-30 DIAGNOSIS — R200 Anesthesia of skin: Secondary | ICD-10-CM | POA: Diagnosis not present

## 2018-11-30 DIAGNOSIS — F419 Anxiety disorder, unspecified: Secondary | ICD-10-CM | POA: Diagnosis not present

## 2018-11-30 DIAGNOSIS — Z8669 Personal history of other diseases of the nervous system and sense organs: Secondary | ICD-10-CM | POA: Diagnosis not present

## 2018-11-30 DIAGNOSIS — R202 Paresthesia of skin: Secondary | ICD-10-CM | POA: Diagnosis not present

## 2018-12-14 DIAGNOSIS — G4733 Obstructive sleep apnea (adult) (pediatric): Secondary | ICD-10-CM | POA: Diagnosis not present

## 2018-12-27 DIAGNOSIS — F0150 Vascular dementia without behavioral disturbance: Secondary | ICD-10-CM | POA: Insufficient documentation

## 2018-12-27 DIAGNOSIS — F0280 Dementia in other diseases classified elsewhere without behavioral disturbance: Secondary | ICD-10-CM | POA: Insufficient documentation

## 2018-12-27 DIAGNOSIS — E538 Deficiency of other specified B group vitamins: Secondary | ICD-10-CM | POA: Diagnosis not present

## 2018-12-27 DIAGNOSIS — E559 Vitamin D deficiency, unspecified: Secondary | ICD-10-CM | POA: Diagnosis not present

## 2018-12-27 DIAGNOSIS — Z131 Encounter for screening for diabetes mellitus: Secondary | ICD-10-CM | POA: Diagnosis not present

## 2018-12-27 DIAGNOSIS — R200 Anesthesia of skin: Secondary | ICD-10-CM | POA: Diagnosis not present

## 2018-12-27 DIAGNOSIS — R202 Paresthesia of skin: Secondary | ICD-10-CM | POA: Diagnosis not present

## 2019-01-11 DIAGNOSIS — G629 Polyneuropathy, unspecified: Secondary | ICD-10-CM | POA: Insufficient documentation

## 2019-01-19 DIAGNOSIS — R202 Paresthesia of skin: Secondary | ICD-10-CM | POA: Diagnosis not present

## 2019-01-19 DIAGNOSIS — R200 Anesthesia of skin: Secondary | ICD-10-CM | POA: Diagnosis not present

## 2019-01-25 DIAGNOSIS — E119 Type 2 diabetes mellitus without complications: Secondary | ICD-10-CM | POA: Diagnosis not present

## 2019-01-26 DIAGNOSIS — R001 Bradycardia, unspecified: Secondary | ICD-10-CM | POA: Diagnosis not present

## 2019-01-26 DIAGNOSIS — Z0000 Encounter for general adult medical examination without abnormal findings: Secondary | ICD-10-CM | POA: Diagnosis not present

## 2019-01-26 DIAGNOSIS — G629 Polyneuropathy, unspecified: Secondary | ICD-10-CM | POA: Diagnosis not present

## 2019-01-26 DIAGNOSIS — Z87448 Personal history of other diseases of urinary system: Secondary | ICD-10-CM | POA: Diagnosis not present

## 2019-02-11 DIAGNOSIS — R202 Paresthesia of skin: Secondary | ICD-10-CM | POA: Diagnosis not present

## 2019-02-11 DIAGNOSIS — R200 Anesthesia of skin: Secondary | ICD-10-CM | POA: Diagnosis not present

## 2019-02-28 DIAGNOSIS — E785 Hyperlipidemia, unspecified: Secondary | ICD-10-CM | POA: Diagnosis not present

## 2019-02-28 DIAGNOSIS — I119 Hypertensive heart disease without heart failure: Secondary | ICD-10-CM | POA: Diagnosis not present

## 2019-02-28 DIAGNOSIS — I25810 Atherosclerosis of coronary artery bypass graft(s) without angina pectoris: Secondary | ICD-10-CM | POA: Diagnosis not present

## 2019-02-28 DIAGNOSIS — R001 Bradycardia, unspecified: Secondary | ICD-10-CM | POA: Diagnosis not present

## 2019-03-01 DIAGNOSIS — I119 Hypertensive heart disease without heart failure: Secondary | ICD-10-CM | POA: Diagnosis not present

## 2019-03-01 DIAGNOSIS — R0602 Shortness of breath: Secondary | ICD-10-CM | POA: Diagnosis not present

## 2019-03-01 DIAGNOSIS — R739 Hyperglycemia, unspecified: Secondary | ICD-10-CM | POA: Diagnosis not present

## 2019-03-01 DIAGNOSIS — I453 Trifascicular block: Secondary | ICD-10-CM | POA: Diagnosis not present

## 2019-03-18 ENCOUNTER — Ambulatory Visit (INDEPENDENT_AMBULATORY_CARE_PROVIDER_SITE_OTHER): Payer: BC Managed Care – PPO | Admitting: Cardiovascular Disease

## 2019-03-18 ENCOUNTER — Other Ambulatory Visit: Payer: Self-pay

## 2019-03-18 ENCOUNTER — Encounter: Payer: Self-pay | Admitting: Cardiovascular Disease

## 2019-03-18 VITALS — BP 146/82 | HR 60 | Ht 71.0 in | Wt 258.4 lb

## 2019-03-18 DIAGNOSIS — I25118 Atherosclerotic heart disease of native coronary artery with other forms of angina pectoris: Secondary | ICD-10-CM

## 2019-03-18 DIAGNOSIS — I10 Essential (primary) hypertension: Secondary | ICD-10-CM | POA: Diagnosis not present

## 2019-03-18 DIAGNOSIS — E7800 Pure hypercholesterolemia, unspecified: Secondary | ICD-10-CM | POA: Diagnosis not present

## 2019-03-18 DIAGNOSIS — I739 Peripheral vascular disease, unspecified: Secondary | ICD-10-CM | POA: Diagnosis not present

## 2019-03-18 NOTE — Progress Notes (Addendum)
Chief Complaint  Patient presents with  . Follow-up    CAD   History of Present Illness: 63 yo male with history of CAD s/p PCI/stent followed by 4V CABG (LIMA to LAD, SVG to OM1, SVG to Ramus, SVG to PDA) October 2014 at [facility], PAD, HTN, COPD, tobacco abuse, HLD, sleep apnea here today for cardiac follow up. I saw him as a new patient September 2015 to establish cardiology care. He has been seen in our [place] office in April 2012 by Dr. Rockey Situ and had been followed after that by Dr. Saralyn Pilar in [place]. Admitted to [facility] October 2014 with chest pain and underwent 4V CABG. Also has COPD and OSA. He is known to have PAD with occluded right SFA. Lower extremity dopplers in our office 02/01/14 with normal ABI on the left and slight reduction of ABI (0.92) on the right with documentation of occluded right SFA. Admitted to [facility] January 2016 with chest pain. Troponin negative. Stress myoview 05/19/14 with no ischemia. When I saw him in 2018 he c/o right leg pain. ABI was 0.92 on the right and 1.1 on the left. We discussed referral to [facility] clinic but he wished to try more walking first. Echo August 2019 with LVEF=65-70%. No valve disease. Nuclear stress test August 2019 with no ischemia.   He is here today for follow up. He has been feeling well overall. He has rare chest pains but he is very active. He denies palpitations, lower extremity edema, orthopnea, PND, dizziness, near syncope or syncope. He has some dyspnea but no change over last few months. His primary care doctor did an echo and a 24 hour monitor. I do not have those results. He was told he needed a stress test.    Addendum: Records received from Dr. Rebecka Apley in [place]. Echo 03/01/19 with normal LV systolic function, [id number]. No valve disease.  Holter monitor showed sinus brady and beta blocker was stopped.   Primary Care Physician: Cletis Athens, MD  Past Medical History:  Diagnosis Date  . Anxiety and depression    a. severe -  seeing psychiatry.  Marland Kitchen CAD (coronary artery disease)    a. s/p 4V CABG 2014  b. 05/2014 MV: No ischemia, EF 69%.  . Chronic prostatitis   . COPD (chronic obstructive pulmonary disease) ([place])    ongoing tobacco use  . GERD (gastroesophageal reflux disease)   . History of DVT (deep vein thrombosis)   . Hyperlipidemia   . Hyperlipidemia   . Hypertension    a. renal arteries patent on remote cath.  . Obesity   . OSA (obstructive sleep apnea)    severe  . PAD (peripheral artery disease) ([place])    a. 01/2014 ABIs R: 0.92, L 1.05.  Know total occlusion of right SFA with distal reconstitution-->Med Rx.  . Sleep apnea   . Tobacco abuse    a. 05/2014 down to 3-5 cigarettes day and vaping also.    Past Surgical History:  Procedure Laterality Date  . CHOLECYSTECTOMY    . CORONARY ARTERY BYPASS GRAFT  October 2014   4V  . CYSTOSCOPY    . KNEE ARTHROSCOPY     right meniscal tear    Current Outpatient Medications  Medication Sig Dispense Refill  . ALPRAZolam (XANAX) 0.25 MG tablet Take 0.25 mg by mouth 2 (two) times daily.    Marland Kitchen aspirin EC 81 MG tablet Take by mouth.    Marland Kitchen atorvastatin (LIPITOR) 80 MG tablet Take 1 tablet (80 mg total)  by mouth daily. 90 tablet 3  . clopidogrel (PLAVIX) 75 MG tablet Take 1 tablet (75 mg total) by mouth daily. 90 tablet 3  . losartan (COZAAR) 25 MG tablet Take 25 mg by mouth daily.  7  . nitroGLYCERIN (NITROSTAT) 0.4 MG SL tablet Place 1 tablet (0.4 mg total) under the tongue every 5 (five) minutes as needed for chest pain. 25 tablet 6  . sertraline (ZOLOFT) 100 MG tablet TAKE 1 1/2 TABLETS BY MOUTH EVERY DAY 45 tablet 0   No current facility-administered medications for this visit.     Allergies  Allergen Reactions  . Sulfa Antibiotics Diarrhea  . Relafen [Nabumetone] Other (See Comments)    Pt doesn't remember     Social History   Socioeconomic History  . Marital status: Married    Spouse name: Not on file  . Number of children: Not on file  .  Years of education: Not on file  . Highest education level: Not on file  Occupational History  . Not on file  Social Needs  . Financial resource strain: Not on file  . Food insecurity    Worry: Not on file    Inability: Not on file  . Transportation needs    Medical: Not on file    Non-medical: Not on file  Tobacco Use  . Smoking status: Former Smoker    Packs/day: 1.00    Years: 40.00    Pack years: 40.00    Types: Cigarettes    Quit date: 08/11/2014    Years since quitting: 4.6  . Smokeless tobacco: Never Used  Substance and Sexual Activity  . Alcohol use: No    Alcohol/week: 0.0 standard drinks  . Drug use: No  . Sexual activity: Yes    Birth control/protection: None  Lifestyle  . Physical activity    Days per week: Not on file    Minutes per session: Not on file  . Stress: Not on file  Relationships  . Social Herbalist on phone: Not on file    Gets together: Not on file    Attends religious service: Not on file    Active member of club or organization: Not on file    Attends meetings of clubs or organizations: Not on file    Relationship status: Not on file  . Intimate partner violence    Fear of current or ex partner: Not on file    Emotionally abused: Not on file    Physically abused: Not on file    Forced sexual activity: Not on file  Other Topics Concern  . Not on file  Social History Narrative  . Not on file    Family History  Problem Relation Age of Onset  . Hypertension Mother   . Cataracts Mother   . Diabetes Father     Review of Systems:  As stated in the HPI and otherwise negative.   BP (!) 146/82   Pulse 60   Ht 5\' 11"  (1.803 m)   Wt 258 lb 6.4 oz (117.2 kg)   SpO2 96%   BMI 36.04 kg/m   Physical Examination: General: Well developed, well nourished, NAD  HEENT: OP clear, mucus membranes moist  SKIN: warm, dry. No rashes. Neuro: No focal deficits  Musculoskeletal: Muscle strength 5/5 all ext  Psychiatric: Mood and affect  normal  Neck: No JVD, no carotid bruits, no thyromegaly, no lymphadenopathy.  Lungs:Clear bilaterally, no wheezes, rhonci, crackles Cardiovascular: Regular rate and rhythm.  No murmurs, gallops or rubs. Abdomen:Soft. Bowel sounds present. Non-tender.  Extremities: No lower extremity edema. Pulses are 2 + in the bilateral DP/PT.  EKG:  EKG is ordered today. The ekg ordered today demonstrates NSR, rate 60 bpm. Incomplete RBBB. Non-specific T wave abnormality.   Echo August 2019:  - Left ventricle: The cavity size was normal. Systolic function was   vigorous. The estimated ejection fraction was in the range of 65%   to 70%. Wall motion was normal; there were no regional wall   motion abnormalities. - Aortic valve: Trileaflet; normal thickness, mildly calcified   leaflets. - Right ventricle: The cavity size was mildly dilated. Wall   thickness was normal.  Recent Labs: No results found for requested labs within last 8760 hours.   Lipid Panel    Component Value Date/Time   CHOL 153 10/26/2015 0744   CHOL 100 03/16/2014 1325   TRIG 65 10/26/2015 0744   TRIG 114 03/16/2014 1325   HDL 51 10/26/2015 0744   HDL 31 (L) 03/16/2014 1325   CHOLHDL 3.0 10/26/2015 0744   VLDL 13 10/26/2015 0744   VLDL 23 03/16/2014 1325   LDLCALC 89 10/26/2015 0744   LDLCALC 46 03/16/2014 1325   LDLDIRECT 122.5 12/12/2008 0840     Wt Readings from Last 3 Encounters:  03/18/19 258 lb 6.4 oz (117.2 kg)  01/08/18 248 lb (112.5 kg)  12/31/17 248 lb (112.5 kg)     Other studies Reviewed: Additional studies/ records that were reviewed today include: . Review of the above records demonstrates:    Assessment and Plan:   1. CAD with stable angina: Rare chest pains which seem stable. LV systolic function normal by echo in 2019. Stress test with no ischemia in 2019. Continue ASA, Plavix and statin.  I will try to get records from his primary care doctor today to make a plan regarding further workup.  Addendum: Echo per Dr. Rebecka Apley 03/01/19 with LVEF=50% and no valve disease. Holter ? October 2020 with sinus brady.   2. HTN: BP is controlled at home  3. HLD: Lipids are followed in primary care. Continue statin.   4. Tobacco abuse, in remission: He has stopped smoking.   5. PAD: Known to have occluded right SFA. ABI slightly decreased on right in 2018. He did not wish to pursue referral to the [facility] at that time. He will call with change in symptoms.   Current medicines are reviewed at length with the patient today.  The patient does not have concerns regarding medicines.  The following changes have been made:  no change  Labs/ tests ordered today include:   Orders Placed This Encounter  Procedures  . EKG 12-Lead    Disposition:   FU with me in 12 months  Signed, Lauree Chandler, MD 03/18/2019 12:47 PM    [address], [place], [place]  [address] Phone: [phone]; Fax: [phone]

## 2019-03-18 NOTE — Patient Instructions (Signed)
Medication Instructions:  Your physician recommends that you continue on your current medications as directed. Please refer to the Current Medication list given to you today.  *If you need a refill on your cardiac medications before your next appointment, please call your pharmacy*  Lab Work: None ordered  If you have labs (blood work) drawn today and your tests are completely normal, you will receive your results only by: Marland Kitchen MyChart Message (if you have MyChart) OR . A paper copy in the mail If you have any lab test that is abnormal or we need to change your treatment, we will call you to review the results.  Testing/Procedures: None ordered  Follow-Up: At [facility], you and your health needs are our priority.  As part of our continuing mission to provide you with exceptional heart care, we have created designated Provider Care Teams.  These Care Teams include your primary Cardiologist (physician) and Advanced Practice Providers (APPs -  Physician Assistants and Nurse Practitioners) who all work together to provide you with the care you need, when you need it.  Your next appointment:   6 months  The format for your next appointment:   Either In Person or Virtual  Provider:   You may see Lauree Chandler, MD or one of the following Advanced Practice Providers on your designated Care Team:    Melina Copa, PA-C  Ermalinda Barrios, PA-C   Other Instructions

## 2019-03-28 ENCOUNTER — Telehealth: Payer: Self-pay | Admitting: Cardiovascular Disease

## 2019-03-28 NOTE — Telephone Encounter (Signed)
New Message  Patient is wanting to see if EKG study was received from Dr. Nelta Numbers  Please call

## 2019-03-29 ENCOUNTER — Telehealth: Payer: Self-pay | Admitting: Cardiovascular Disease

## 2019-03-29 NOTE — Telephone Encounter (Signed)
error 

## 2019-03-29 NOTE — Telephone Encounter (Signed)
I checked with medical records who has not yet received requested records (monitor, echo, notes, etc) from Dr. Cletis Athens.  Pt informed.  He is going to call them for update.

## 2019-03-29 NOTE — Telephone Encounter (Signed)
Patient was calling to follow up in reference to seeing if Dr. Angelena Form has reviewed his EKG results.

## 2019-04-04 DIAGNOSIS — G5603 Carpal tunnel syndrome, bilateral upper limbs: Secondary | ICD-10-CM | POA: Diagnosis not present

## 2019-04-19 ENCOUNTER — Telehealth: Payer: Self-pay | Admitting: Cardiovascular Disease

## 2019-04-19 NOTE — Telephone Encounter (Signed)
New Message    Pt says he is calling to follow up from the [place] of a Holter monitor that he sent in  He says he would like to know the results soon, he hasnt been feeling great    Please call

## 2019-04-21 NOTE — Telephone Encounter (Addendum)
Called patient to inform we received the "thumb drive" for 24 hr holter monitor by Dr. Lavera Guise.  We have scanned reports in chart but the patient has a summary of the monitor from 02/28/2019 and is reading to me that he had 55 exceeding 2.5 seconds including one that was 7.1 seconds at 4:22 pm in the afternoon.  He was not aware of this.  He does have sleep apnea but is sure he was not asleep at that time.     I will place the thumb drive in Dr. Camillia Herter folder to review.  Pt is going to try to upload the report that includes the 7.1 second pause in a MyChart message this evening.

## 2019-04-22 NOTE — Telephone Encounter (Signed)
I spoke with the patient and notified of the review of his cardiac monitor report.  He is pleased with the information provided and will call back if an dizziness occurs or he develops any other symptoms.

## 2019-04-22 NOTE — Telephone Encounter (Signed)
I spoke to Dr. Harrington Challenger this am about the strips and also reviewed them. I think the 'pauses' are artifact. If he is feeling well, nothing else to do now. If he has any dizziness, he should let us know so we can put on one of our monitors. Gerald Stabs

## 2019-05-18 DIAGNOSIS — I25810 Atherosclerosis of coronary artery bypass graft(s) without angina pectoris: Secondary | ICD-10-CM | POA: Diagnosis not present

## 2019-05-18 DIAGNOSIS — I119 Hypertensive heart disease without heart failure: Secondary | ICD-10-CM | POA: Diagnosis not present

## 2019-05-18 DIAGNOSIS — R001 Bradycardia, unspecified: Secondary | ICD-10-CM | POA: Diagnosis not present

## 2019-05-18 DIAGNOSIS — R0602 Shortness of breath: Secondary | ICD-10-CM | POA: Diagnosis not present

## 2019-06-13 ENCOUNTER — Encounter: Payer: Self-pay | Admitting: Gastroenterology

## 2019-06-13 ENCOUNTER — Other Ambulatory Visit: Payer: Self-pay

## 2019-06-13 ENCOUNTER — Ambulatory Visit (INDEPENDENT_AMBULATORY_CARE_PROVIDER_SITE_OTHER): Payer: BC Managed Care – PPO | Admitting: Gastroenterology

## 2019-06-13 VITALS — BP 150/80 | HR 72 | Temp 98.2°F | Ht 71.0 in | Wt 266.2 lb

## 2019-06-13 DIAGNOSIS — R0789 Other chest pain: Secondary | ICD-10-CM | POA: Diagnosis not present

## 2019-06-13 DIAGNOSIS — R1310 Dysphagia, unspecified: Secondary | ICD-10-CM

## 2019-06-13 DIAGNOSIS — R1319 Other dysphagia: Secondary | ICD-10-CM

## 2019-06-13 MED ORDER — PANTOPRAZOLE SODIUM 40 MG PO TBEC: 40.0000 mg | DELAYED_RELEASE_TABLET | Freq: Every day | ORAL | 6 refills | Status: DC

## 2019-06-13 NOTE — Progress Notes (Signed)
Gastroenterology Consultation  Referring Provider:     Cletis Athens, MD Primary Care Physician:  Cletis Athens, MD Primary Gastroenterologist:  Dr. Allen Norris     Reason for Consultation:     Dysphagia and chest pain        HPI:   Richard Burton is a 64 y.o. y/o male referred for consultation & management of dysphagia and chest pain by Dr. Cletis Athens, MD.  This patient was sent to me for evaluation for dysphagia and chest pain.  The patient was seen by cardiology in November and reported the patient to be stable.  The patient did have a EGD and colonoscopy back in September 2010 and at that time biopsy showed:  COLON, DESCENDING AND SIGMOID, BIOPSY:  - TUBULAR ADENOMA (ONE FRAGMENT).  - HYPERPLASTIC POLYPS.  - POLYPOID FRAGMENT OF BENIGN COLONIC MUCOSA WITH INTRAMUCOSAL LYMPHOID AGGREGATES.  - NO HIGH GRADE DYSPLASIA OR MALIGNANCY.    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 GLANDULAR ATYPIA INDEFINITE FOR DYSPLASIA.   From the endoscopy report it was noted that the patient was supposed to follow-up in 2 months from the time of the procedures with his GI doctor in [place].  I do not have any records to show any follow-up with GI. On questioning the patient he reports that he did not have any follow-up for his upper endoscopy or colonoscopy.  He has not had any repeat procedures since 2010 when the adenomatous polyp and intestinal metaplasia was found in his esophagus.  He denies any unexplained weight loss and states that he is actually gaining weight.  There is no report of any black stools but he does report some blood in his stools.  The patient has been on Plavix for some time and has been taking Nexium with the Plavix and reports that he has been taking it over-the-counter when his insurance stopped covering it.  Past Medical History:  Diagnosis Date  . Anxiety and depression    a. severe - seeing psychiatry.  Marland Kitchen  CAD (coronary artery disease)    a. s/p 4V CABG 2014  b. 05/2014 MV: No ischemia, EF 69%.  . Chronic prostatitis   . COPD (chronic obstructive pulmonary disease) ([place])    ongoing tobacco use  . GERD (gastroesophageal reflux disease)   . History of DVT (deep vein thrombosis)   . Hyperlipidemia   . Hyperlipidemia   . Hypertension    a. renal arteries patent on remote cath.  . Obesity   . OSA (obstructive sleep apnea)    severe  . PAD (peripheral artery disease) ([place])    a. 01/2014 ABIs R: 0.92, L 1.05.  Know total occlusion of right SFA with distal reconstitution-->Med Rx.  . Sleep apnea   . Tobacco abuse    a. 05/2014 down to 3-5 cigarettes day and vaping also.    Past Surgical History:  Procedure Laterality Date  . CHOLECYSTECTOMY    . CORONARY ARTERY BYPASS GRAFT  October 2014   4V  . CYSTOSCOPY    . KNEE ARTHROSCOPY     right meniscal tear    Prior to Admission medications   Medication Sig Start Date End Date Taking? Authorizing Provider  ALPRAZolam (XANAX) 0.25 MG tablet Take 0.25 mg by mouth 2 (two) times daily.    [provider]  aspirin EC 81 MG tablet Take by mouth.    [provider]  atorvastatin (LIPITOR) 80 MG tablet Take 1 tablet (  80 mg total) by mouth daily. 12/31/17   Burnell Blanks, MD  clopidogrel (PLAVIX) 75 MG tablet Take 1 tablet (75 mg total) by mouth daily. 01/06/18   Burnell Blanks, MD  losartan (COZAAR) 25 MG tablet Take 25 mg by mouth daily. 10/23/17   [provider]  nitroGLYCERIN (NITROSTAT) 0.4 MG SL tablet Place 1 tablet (0.4 mg total) under the tongue every 5 (five) minutes as needed for chest pain. 01/06/18   Burnell Blanks, MD  sertraline (ZOLOFT) 100 MG tablet TAKE 1 1/2 TABLETS BY MOUTH EVERY DAY 05/15/15   Jerrol Banana., MD    Family History  Problem Relation Age of Onset  . Hypertension Mother   . Cataracts Mother   . Diabetes Father      Social History   Tobacco Use  .  Smoking status: Former Smoker    Packs/day: 1.00    Years: 40.00    Pack years: 40.00    Types: Cigarettes    Quit date: 08/11/2014    Years since quitting: 4.8  . Smokeless tobacco: Never Used  Substance Use Topics  . Alcohol use: No    Alcohol/week: 0.0 standard drinks  . Drug use: No    Allergies as of 06/13/2019 - Review Complete 03/18/2019  Allergen Reaction Noted  . Sulfa antibiotics Diarrhea 05/19/2014  . Relafen [nabumetone] Other (See Comments) 08/20/2010    Review of Systems:    All systems reviewed and negative except where noted in HPI.   Physical Exam:  There were no vitals taken for this visit. No LMP for male patient. General:   Alert,  Well-developed, well-nourished, pleasant and cooperative in NAD Head:  Normocephalic and atraumatic. Eyes:  Sclera clear, no icterus.   Conjunctiva pink. Ears:  Normal auditory acuity. Neck:  Supple; no masses or thyromegaly. Lungs:  Respirations even and unlabored.  Clear throughout to auscultation.   No wheezes, crackles, or rhonchi. No acute distress. Heart:  Regular rate and rhythm; no murmurs, clicks, rubs, or gallops. Abdomen:  Normal bowel sounds.  No bruits.  Soft, non-tender and non-distended without masses, hepatosplenomegaly or hernias noted.  No guarding or rebound tenderness.  Negative Carnett sign.   Rectal:  Deferred.  Pulses:  Normal pulses noted. Extremities:  No clubbing or edema.  No cyanosis. Neurologic:  Alert and oriented x3;  grossly normal neurologically. Skin:  Intact without significant lesions or rashes.  No jaundice. Lymph Nodes:  No significant cervical adenopathy. Psych:  Alert and cooperative. Normal mood and affect.  Imaging Studies: No results found.  Assessment and Plan:   Richard Burton is a 64 y.o. y/o male who comes in today with a history of adenomatous polyps and intestinal metaplasia on esophageal biopsies who now has dysphagia that started around the time of Christmas.  The patient  reports that the symptoms are better now but was concerned about his previous pathology.  The patient will be switched to pantoprazole since it does not interact with his Plavix like Nexium does and he has also been set up for an upper endoscopy and colonoscopy due to his intestinal metaplasia dysphagia and history of adenomatous polyps.  The patient has been explained the plan and agrees with it.    Lucilla Lame, MD. Marval Regal    Note: This dictation was prepared with Dragon dictation along with smaller phrase technology. Any transcriptional errors that result from this process are unintentional.

## 2019-07-18 ENCOUNTER — Encounter: Admission: RE | Payer: Self-pay | Source: Home / Self Care

## 2019-07-18 ENCOUNTER — Ambulatory Visit
Admission: RE | Admit: 2019-07-18 | Payer: BC Managed Care – PPO | Source: Home / Self Care | Admitting: Gastroenterology

## 2019-07-18 SURGERY — COLONOSCOPY WITH PROPOFOL
Anesthesia: Choice

## 2019-09-27 ENCOUNTER — Ambulatory Visit (INDEPENDENT_AMBULATORY_CARE_PROVIDER_SITE_OTHER): Payer: BC Managed Care – PPO | Admitting: Internal Medicine

## 2019-09-27 ENCOUNTER — Other Ambulatory Visit: Payer: Self-pay

## 2019-09-27 ENCOUNTER — Encounter: Payer: Self-pay | Admitting: Internal Medicine

## 2019-09-27 DIAGNOSIS — J449 Chronic obstructive pulmonary disease, unspecified: Secondary | ICD-10-CM

## 2019-09-27 DIAGNOSIS — I25718 Atherosclerosis of autologous vein coronary artery bypass graft(s) with other forms of angina pectoris: Secondary | ICD-10-CM

## 2019-09-27 DIAGNOSIS — Z6841 Body Mass Index (BMI) 40.0 and over, adult: Secondary | ICD-10-CM

## 2019-09-27 DIAGNOSIS — F419 Anxiety disorder, unspecified: Secondary | ICD-10-CM

## 2019-09-27 DIAGNOSIS — I10 Essential (primary) hypertension: Secondary | ICD-10-CM | POA: Diagnosis not present

## 2019-09-27 DIAGNOSIS — E662 Morbid (severe) obesity with alveolar hypoventilation: Secondary | ICD-10-CM

## 2019-09-27 DIAGNOSIS — Z951 Presence of aortocoronary bypass graft: Secondary | ICD-10-CM

## 2019-09-27 MED ORDER — NITROGLYCERIN 0.4 MG SL SUBL: 0.4000 mg | SUBLINGUAL_TABLET | SUBLINGUAL | 3 refills | Status: AC | PRN

## 2019-09-27 MED ORDER — ALPRAZOLAM 0.25 MG PO TABS: 0.2500 mg | ORAL_TABLET | Freq: Two times a day (BID) | ORAL | 0 refills | Status: AC

## 2019-09-27 NOTE — Assessment & Plan Note (Signed)
No angina 

## 2019-09-27 NOTE — Assessment & Plan Note (Signed)
ch problem 

## 2019-09-27 NOTE — Progress Notes (Signed)
Established Patient Office Visit  Subjective:  Patient ID: Richard Burton, male    DOB: 18-Oct-1955  Age: 64 y.o. MRN: [id number]  CC:  Chief Complaint  Patient presents with  . Anxiety    patient needs refill on xanax     HPI  Richard Burton presents for refill on anxiety medication.  Past Medical History:  Diagnosis Date  . Anxiety and depression    a. severe - seeing psychiatry.  Marland Kitchen CAD (coronary artery disease)    a. s/p 4V CABG 2014  b. 05/2014 MV: No ischemia, EF 69%.  . Chronic prostatitis   . COPD (chronic obstructive pulmonary disease) ([place])    ongoing tobacco use  . GERD (gastroesophageal reflux disease)   . History of DVT (deep vein thrombosis)   . Hyperlipidemia   . Hyperlipidemia   . Hypertension    a. renal arteries patent on remote cath.  . Obesity   . OSA (obstructive sleep apnea)    severe  . PAD (peripheral artery disease) ([place])    a. 01/2014 ABIs R: 0.92, L 1.05.  Know total occlusion of right SFA with distal reconstitution-->Med Rx.  . Sleep apnea   . Tobacco abuse    a. 05/2014 down to 3-5 cigarettes day and vaping also.    Past Surgical History:  Procedure Laterality Date  . CHOLECYSTECTOMY    . CORONARY ARTERY BYPASS GRAFT  October 2014   4V  . CYSTOSCOPY    . KNEE ARTHROSCOPY     right meniscal tear    Family History  Problem Relation Age of Onset  . Hypertension Mother   . Cataracts Mother   . Diabetes Father     Social History   Socioeconomic History  . Marital status: Married    Spouse name: Not on file  . Number of children: Not on file  . Years of education: Not on file  . Highest education level: Not on file  Occupational History  . Not on file  Tobacco Use  . Smoking status: Former Smoker    Packs/day: 1.00    Years: 40.00    Pack years: 40.00    Types: Cigarettes    Quit date: 08/11/2014    Years since quitting: 5.1  . Smokeless tobacco: Never Used  Substance and Sexual Activity  . Alcohol use: No     Alcohol/week: 0.0 standard drinks  . Drug use: No  . Sexual activity: Yes    Birth control/protection: None  Other Topics Concern  . Not on file  Social History Narrative  . Not on file   Social Determinants of Health   Financial Resource Strain:   . Difficulty of Paying Living Expenses:   Food Insecurity:   . Worried About Charity fundraiser in the Last Year:   . Arboriculturist in the Last Year:   Transportation Needs:   . Film/video editor (Medical):   Marland Kitchen Lack of Transportation (Non-Medical):   Physical Activity:   . Days of Exercise per Week:   . Minutes of Exercise per Session:   Stress:   . Feeling of Stress :   Social Connections:   . Frequency of Communication with Friends and Family:   . Frequency of Social Gatherings with Friends and Family:   . Attends Religious Services:   . Active Member of Clubs or Organizations:   . Attends Archivist Meetings:   Marland Kitchen Marital Status:   Intimate Partner Violence:   .  Fear of Current or Ex-Partner:   . Emotionally Abused:   Marland Kitchen Physically Abused:   . Sexually Abused:      Current Outpatient Medications:  .  ALPRAZolam (XANAX) 0.25 MG tablet, Take 1 tablet (0.25 mg total) by mouth 2 (two) times daily., Disp: 60 tablet, Rfl: 0 .  aspirin EC 81 MG tablet, Take by mouth., Disp: , Rfl:  .  atorvastatin (LIPITOR) 80 MG tablet, Take 1 tablet (80 mg total) by mouth daily., Disp: 90 tablet, Rfl: 3 .  clopidogrel (PLAVIX) 75 MG tablet, Take 1 tablet (75 mg total) by mouth daily., Disp: 90 tablet, Rfl: 3 .  losartan (COZAAR) 50 MG tablet, Take 50 mg by mouth daily., Disp: , Rfl:  .  nitroGLYCERIN (NITROSTAT) 0.4 MG SL tablet, Place 1 tablet (0.4 mg total) under the tongue every 5 (five) minutes as needed for chest pain., Disp: 25 tablet, Rfl: 6 .  pantoprazole (PROTONIX) 40 MG tablet, Take 1 tablet (40 mg total) by mouth daily., Disp: 30 tablet, Rfl: 6 .  sertraline (ZOLOFT) 100 MG tablet, TAKE 1 1/2 TABLETS BY MOUTH EVERY  DAY, Disp: 45 tablet, Rfl: 0   Allergies  Allergen Reactions  . Sulfa Antibiotics Diarrhea  . Relafen [Nabumetone] Other (See Comments)    Pt doesn't remember     ROS Review of Systems  Constitutional: Negative for appetite change.  HENT: Negative for hearing loss.   Eyes: Negative for itching.  Respiratory:       Patient has sleep apnea.  And he is going to see the pulmonary specialist for that.  Cardiovascular: Negative for chest pain, palpitations and leg swelling.  Gastrointestinal: Negative for abdominal distention.  Genitourinary: Negative.   Musculoskeletal: Negative.   Hematological: Negative.       Objective:    Physical Exam  Constitutional: He is oriented to person, place, and time. He appears well-developed and well-nourished.  HENT:  Head: Normocephalic.  Eyes: Pupils are equal, round, and reactive to light.  Neck: No JVD present. No tracheal deviation present. No thyromegaly present.  Cardiovascular:  No murmur heard. Abdominal: Bowel sounds are normal.  Musculoskeletal:        General: No edema.     Cervical back: Normal range of motion.  Lymphadenopathy:    He has no cervical adenopathy.  Neurological: He is alert and oriented to person, place, and time.  Skin: No erythema.    There were no vitals taken for this visit. Wt Readings from Last 3 Encounters:  06/13/19 266 lb 3.2 oz (120.7 kg)  03/18/19 258 lb 6.4 oz (117.2 kg)  01/08/18 248 lb (112.5 kg)     Health Maintenance Due  Topic Date Due  . Hepatitis C Screening  Never done  . HIV Screening  Never done  . COVID-19 Vaccine (1) Never done  . COLONOSCOPY  Never done  . TETANUS/TDAP  12/19/2014    There are no preventive care reminders to display for this patient.  Lab Results  Component Value Date   TSH 0.720 03/16/2014   Lab Results  Component Value Date   WBC 7.3 05/19/2014   HGB 15.9 05/19/2014   HCT 45.2 05/19/2014   MCV 86.3 05/19/2014   PLT 188 05/19/2014   Lab Results   Component Value Date   NA 140 05/19/2014   K 4.5 05/19/2014   CO2 22 05/19/2014   GLUCOSE 97 05/19/2014   BUN 12 05/19/2014   CREATININE 1.07 05/19/2014   BILITOT 0.6 10/26/2015  ALKPHOS 69 10/26/2015   AST 19 10/26/2015   ALT 16 10/26/2015   PROT 7.1 10/26/2015   ALBUMIN 4.3 10/26/2015   CALCIUM 9.5 05/19/2014   ANIONGAP 14 05/19/2014   Lab Results  Component Value Date   CHOL 153 10/26/2015   Lab Results  Component Value Date   HDL 51 10/26/2015   Lab Results  Component Value Date   LDLCALC 89 10/26/2015   Lab Results  Component Value Date   TRIG 65 10/26/2015   Lab Results  Component Value Date   CHOLHDL 3.0 10/26/2015   Lab Results  Component Value Date   HGBA1C 5.2 02/04/2006      Assessment & Plan:   Problem List Items Addressed This Visit      Cardiovascular and Mediastinum   CAD (coronary artery disease)   Hypertension     Respiratory   COPD (chronic obstructive pulmonary disease) (HCC)     Other   Anxiety - Primary    ch problem      Relevant Medications   ALPRAZolam (XANAX) 0.25 MG tablet   Obesity   S/P CABG x 4    No angina         Meds ordered this encounter  Medications  . ALPRAZolam (XANAX) 0.25 MG tablet    Sig: Take 1 tablet (0.25 mg total) by mouth 2 (two) times daily.    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 Anxiety - ALPRAZolam (XANAX) 0.25 MG tablet; Take 1 tablet (0.25 mg total) by mouth 2 (two) times daily.  Dispense: 60 tablet; Refill: 0  2. Essential hypertension Blood pressure is elevated he was advised to take his medicine on  the regular basis.  3. Chronic obstructive pulmonary disease, unspecified COPD type ([place]) Is not a smoker  4. 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  5. Class 3 obesity with alveolar hypoventilation, serious comorbidity, and body mass index (BMI) of 40.0 to 44.9 in adult [facility]) It is a major problem he was advised to lose at least 50 pounds of weight  6. S/P CABG x 4 Stable Follow-up: Return in about 3 months (around 12/28/2019).    Cletis Athens, MD

## 2019-11-04 ENCOUNTER — Other Ambulatory Visit: Payer: Self-pay

## 2019-11-04 ENCOUNTER — Ambulatory Visit (INDEPENDENT_AMBULATORY_CARE_PROVIDER_SITE_OTHER): Payer: BC Managed Care – PPO | Admitting: Internal Medicine

## 2019-11-04 ENCOUNTER — Encounter: Payer: Self-pay | Admitting: Internal Medicine

## 2019-11-04 VITALS — BP 150/76 | HR 61 | Ht 71.0 in | Wt 263.5 lb

## 2019-11-04 DIAGNOSIS — I10 Essential (primary) hypertension: Secondary | ICD-10-CM

## 2019-11-04 DIAGNOSIS — I739 Peripheral vascular disease, unspecified: Secondary | ICD-10-CM

## 2019-11-04 DIAGNOSIS — Z0000 Encounter for general adult medical examination without abnormal findings: Secondary | ICD-10-CM

## 2019-11-04 DIAGNOSIS — F419 Anxiety disorder, unspecified: Secondary | ICD-10-CM | POA: Diagnosis not present

## 2019-11-04 DIAGNOSIS — I25810 Atherosclerosis of coronary artery bypass graft(s) without angina pectoris: Secondary | ICD-10-CM

## 2019-11-04 MED ORDER — ALPRAZOLAM 0.5 MG PO TABS: 0.5000 mg | ORAL_TABLET | Freq: Two times a day (BID) | ORAL | 0 refills | Status: AC

## 2019-11-04 NOTE — Assessment & Plan Note (Signed)
-   I encouraged the patient to lose weight.  - I educated them on making healthy dietary choices including eating more fruits and vegetables and less fried foods. - I encouraged the patient to exercise more, and educated on the benefits of exercise including weight loss, diabetes.   

## 2019-11-04 NOTE — Assessment & Plan Note (Signed)
Ref to vascular specialist

## 2019-11-04 NOTE — Patient Instructions (Signed)
The [facility] Diet: A weight-loss program for life The [facility] Diet is a lifestyle approach to weight loss that can help you maintain a healthy weight for a lifetime. By [facility] Staff  Definition The [place] is a long-term weight management program created by a team of weight-loss experts at [facility]. The [facility] Diet is designed to help you reshape your lifestyle by adopting healthy new habits and breaking unhealthy old ones. The goal is to make simple, pleasurable changes that will result in a healthy weight that you can maintain for the rest of your life.  Purpose The purpose of the [place] is to help you lose excess weight and to find a way of eating that you can sustain for a lifetime. It focuses on changing your daily routine by adding and breaking habits that can make a difference in your weight, such as eating more fruits and vegetables, not eating while you watch TV, and moving your body for 30 minutes a day.  The [facility] Diet also stresses key components of behavior change, such as finding your inner motivation to lose weight, setting achievable goals and handling setbacks.  Why you might follow the [place] might choose to follow the [place] because you: . Want to follow a diet that has been developed by medical professionals . Enjoy the types and amounts of food featured in the diet, including unlimited vegetables and fruits . Want to learn how to drop unhealthy lifestyle habits and gain healthy ones . Want to improve your health and reduce your health risks by becoming more active and eating the recommended foods . Don't want to be precise about counting calories or grams of fat or eliminate entire groups of foods . Want a diet you can stick with for life, not a fad or quick fix . Check with your doctor or health care provider before starting any weight-loss diet, especially if you have any health conditions.    Diet  Details  The [place] is the official diet developed by [facility], based on research and clinical experience. It focuses on eating healthy foods that taste great and increasing physical activity. It emphasizes that the best way to keep weight off for good is to change your lifestyle and adopt new health habits. This diet can be tailored to your own individual needs and health history -- it isn't a one-size-fits-all approach.  The [facility] Diet has two main parts:  1. Lose It! This two-week phase is designed to jump-start your weight loss, so you may lose up to 6 to 10 pounds (2.7 to 4.5 kilograms) in a safe and healthy way. In this phase, you focus on lifestyle habits that are associated with weight. You learn how to add five healthy habits, break five unhealthy habits and adopt another five bonus healthy habits. This phase can help you see some quick results -- a psychological boost -- and start practicing important habits that you'll carry into the next phase of the diet. 2. Live It! This phase is a lifelong approach to diet and health. In this phase, you learn more about food choices, portion sizes, menu planning, physical activity, exercise and sticking to healthy habits. You may continue to see a steady weight loss of 1 to 2 pounds (0.5 to 1 kilogram) a week until you reach your goal weight. This phase can also help you maintain your goal weight permanently.  Follow  the [place] Weight Pyramid The [facility] Diet doesn't require you to be precise about counting calories or grams of fat. Instead, the [facility] Weight Pyramid serves as a guide to making smart eating choices. The main message is simple: Eat most of your food from the groups at the base of the pyramid and less from the top -- and move more.  Eat healthy foods and portions The base of the [place] Weight Pyramid focuses on generous amounts of healthy foods that contain a smaller number of  calories in a large volume of food, particularly fruits and vegetables.  This principle involves eating low-energy-dense foods and can help you lose weight by feeling full on fewer calories. Healthy choices in each of the other food groups in moderate amounts make up the rest of the pyramid -- including whole-grain carbohydrates, lean sources of protein such as legumes, fish and low-fat dairy, and heart-healthy unsaturated fats.  The [facility] Diet teaches you how to estimate portion sizes and plan meals. The diet doesn't require you to eliminate any foods.  Increase your physical activity The [facility] Diet provides practical and realistic ideas for including more physical activity and exercise throughout your day -- as well as finding a plan that works for you. The diet recommends getting at least 30 minutes of exercise every day and even more exercise for further health benefits and weight loss. The diet also emphasizes moving more throughout the day, such as taking the stairs instead of an elevator.  If you've been inactive or you have a medical condition, talk to your doctor or health care provider before starting a new physical activity program. Most people can begin with five- or 10-minute activity sessions and increase the time gradually.  Typical menu for the [place] provides several calorie levels. Here's a look at a typical daily meal plan at the 1,200-calorie-a-day level:  . Breakfast: 1/2 cup cooked oatmeal with 1 cup milk and 2 tablespoons raisins, 1/4 cup mango, calorie-free beverage . Lunch: Quinoa and sweet potato cakes, tossed salad with fat-free dressing, calorie-free beverage . Dinner: 1 pita pizza, 3/4 cup mixed fruit, calorie-free beverage . Snack: 1 cup sliced bell peppers and 2 tablespoons hummus You can have sweets -- as long as you limit them to 75 calories a day. For practicality, consider thinking of your sweets calories over the course  of a week. Have low-fat frozen yogurt or dark chocolate on Monday, and then hold off on any more sweets for a few days.  Results  Weight loss The [facility] Diet is designed to help you lose up to 6 to 10 pounds (2.7 to 4.5 kilograms) during the initial two-week phase. After that, you transition into the second phase, where you continue to lose 1 to 2 pounds (0.5 to 1 kilogram) a week until you reach your goal weight. By continuing the lifelong habits that you've learned, you can then maintain your goal weight for the rest of your life.  Most people can lose weight on almost any diet plan that restricts calories -- at least in the short term. The goal of the [place] is to help you keep weight off permanently by making smarter food choices, learning how to manage setbacks and changing your lifestyle.  Other health benefits In general, losing weight by following a healthy, nutritious diet -- such as the [place] -- can reduce your risk of weight-related  health problems, such as diabetes, heart disease, high blood pressure and sleep apnea. If you already have any of these conditions, they may be improved dramatically if you lose weight, regardless of the diet plan you follow.  In addition, the healthy habits and kinds of foods recommended on the [place] -- including lots of vegetables, fruits, whole grains, nuts, beans, fish and healthy fats -- can further reduce your risk of certain health conditions. The [facility] Diet is meant to be positive, practical, sustainable and enjoyable, so you can enjoy a happier, healthier life over the long term.  Risks  The [facility] Diet is generally safe for most adults. It does encourage unlimited amounts of vegetables and fruits. For most people, eating lots of fruits and vegetables is a good thing -- these foods provide your body with important nutrients and fiber. However, if you aren't used to having fiber in your diet, you may  experience minor, temporary changes in digestion, such as intestinal gas, as your body adjusts to this new way of eating.  Also, the natural sugar in fruit does affect your carbohydrate intake -- especially if you eat a lot of fruit. This may temporarily raise your blood sugar or certain blood fats. However, this effect is lessened if you are losing weight. If you have diabetes or any other health conditions or concerns, work with your doctor to adjust the [place] for your situation. For example, people with diabetes should aim for more vegetables than fruits, if possible. It's a good idea to snack on vegetables, rather than snacking only on fruit.    Sample menu under the [place] Weight Pyramid  This sample menu for a day follows the [facility] Weight Pyramid. The sample shows you how to fit in servings from each category in the pyramid without going over 1,200 calories. You can adjust the menu to suit your own tastes and calorie needs.  Sample menu (1,200 calories)  Breakfast . 3/4 cup hot whole-grain cereal . 1 small banana . Calorie-free beverage  Lunch . Tossed salad (2 cups romaine, 1/4 onion, 1/4 cup mushrooms, 1 medium tomato, 1 hard-boiled egg, 1/2 cup low-fat shredded cheddar cheese) . 1 whole-wheat dinner roll . 1 1/2 teaspoons butter . 1/2 cup cubed pineapple . Calorie-free beverage  Dinner . 3 ounces seared scallops in 1 teaspoon olive oil . Garlic mashed cauliflower potatoes . 1/2 cup beets . Calorie-free beverage  Snack (any time) . 2 plums . 8 wheat crackers   Nutrient analysis and servings for the sample menu  Calories   1,178 Total fat   34 g Saturated fat   12 g Monounsaturated fat  11 g Cholesterol   256 mg Sodium   1,761 mg Total carbohydrate  157 g Dietary fiber   24 g Trans fat   Trace Total sugar   58 g Added sugar   0 g Protein    61 g  [facility] Healthy Weight Pyramid servings with the sample  menu Vegetables   4 Fruits    3 Carbohydrates  3 1/2 Protein and dairy  3 Fats    3 Sweets   0   Healthy snacking to help fill you up  If you feel hungry while following the [facility] Weight Pyramid, reach for more fruits and vegetables to snack on. Just make sure your fruit is either fresh or canned in water or juice -- and that you pour off the  liquid before eating.   More information can be found at BakingBrokers.se. Information provided from:  [email or web address].pl [email or web address]    Exercise Information for Aging Adults Staying physically active is important as you age. The four types of exercises that are best for older adults are endurance, strength, balance, and flexibility. Contact your health care provider before you start any exercise routine. Ask your health care provider what activities are safe for you.  What are the risks? Risks associated with exercising include:  Overdoing it. This may lead to sore muscles or fatigue.  Falls.  Injuries.  Dehydration.  How to do these exercises  Endurance exercises Endurance (aerobic) exercises raise your breathing rate and heart rate. Increasing your endurance helps you to do everyday tasks and stay healthy. By improving the health of your body system that includes your heart, lungs, and blood vessels (circulatory system), you may also delay or prevent diseases such as heart disease, diabetes, and bone loss (osteoporosis). Types of endurance exercises include:  Sports.  Indoor activities, such as using gym equipment, doing water aerobics, or dancing.  Outdoor activities, such as biking or jogging.  Tasks around the house, such as gardening, yard work, and heavy household chores like cleaning.  Walking, such as hiking or walking around  your neighborhood. When doing endurance exercises, make sure you:  Are aware of your surroundings.  Use safety equipment as directed.  Dress in layers when exercising outdoors.  Drink plenty of water to stay well hydrated. Build up endurance slowly. Start with 10 minutes at a time, and gradually build up to doing 30 minutes at a time. Unless your health care provider gave you different instructions, aim to exercise for a total of 150 minutes a week. Spread out that time so you are working on endurance on 3 or more days a week.  Strength exercises Lifting, pulling, or pushing weights helps to strengthen muscles. Having stronger muscles makes it easier to do everyday activities, such as getting up from a chair, climbing stairs, carrying groceries, and playing with grandchildren. Strength exercises include arm and leg exercises that may be done:  With weights.  Without weights (using your own body weight).  With a resistance band. When doing strength exercises:  Move smoothly and steadily. Do not suddenly thrust or jerk the weights, the resistance band, or your body.  Start with no weights or with light weights, and gradually add more weight over time. Eventually, aim to use weights that are hard or very hard for you to lift. This means that you are able to do 8 repetitions with the weight, and the last few repetitions are very challenging.  Lift or push weights into position for 3 seconds, hold the position for 1 second, and then take 3 seconds to return to your starting position.  Breathe out (exhale) during difficult movements, like lifting or pushing weights. Breathe in (inhale) to relax your muscles before the next repetition.  Consider alternating arms or legs, especially when you first start strength exercises.  Expect some slight muscle soreness after each session. Do strength exercises on 2 or more days a week, for 30 minutes at a time. Avoid exercising the same muscle groups  two days in a row. For example, if you work on your leg muscles one day, work on your arm muscles the next day. When you can do two sets of 10-15 repetitions with a certain weight, increase the amount of weight.  Balance Balance exercises can help to prevent falls. Balance  exercises include:  Standing on one foot.  Heel-to-toe walk.  Balance walk.  Tai chi. Make sure you have something sturdy to hold onto while doing balance exercises, such as a sturdy chair. As your balance improves, challenge yourself by holding onto the chair with one hand instead of two, and then with no hands. Trying exercises with your eyes closed also challenges your balance, but be sure to have a sturdy surface (like a countertop) close by in case you need it. Do balance exercises as often as you want, or as often as directed by your health care provider. Strength exercises for the lower body also help to improve balance.  Flexibility  Flexibility exercises improve how far you can bend, straighten, move, or rotate parts of your body (range of motion). These exercises also help you to do everyday activities such as getting dressed or reaching for objects. Flexibility exercises include stretching different parts of the body, and they may be done in a standing or seated position or on the floor. When stretching, make sure you:  Keep a slight bend in your arms and legs. Avoid completely straightening ("locking") your joints.  Do not stretch so far that you feel pain. You should feel a mild stretching feeling. You may try stretching farther as you become more flexible over time.  Relax and breathe between stretches.  Hold onto something sturdy for balance as needed. Hold each stretch for 10-30 seconds. Repeat each stretch 3-5 times.  General safety tips  Exercise in well-lit areas.  Do not hold your breath during exercises or stretches.  Warm up before exercising, and cool down after exercising. This can help  prevent injury.  Drink plenty of water during exercise or any activity that makes you sweat.  Use smooth, steady movements. Do not use sudden, jerking movements, especially when lifting weights or doing flexibility exercises.  If you are not sure if an exercise is safe for you, or you are not sure how to do an exercise, talk with your health care provider. This is especially important if you have had surgery on muscles, bones, or joints (orthopedic surgery).  Where to find more information You can find more information about exercise for older adults from:  Your local health department, fitness center, or community center. These facilities may have programs for aging adults.  [employer] on Aging: http://kim-miller.com/  National Council on Aging: www.ncoa.org  Summary  Staying physically active is important as you age.  Make sure to contact your health care provider before you start any exercise routine. Ask your health care provider what activities are safe for you.  Doing endurance, strength, balance, and flexibility exercises can help to delay or prevent certain diseases, such as heart disease, diabetes, and bone loss (osteoporosis). This information is not intended to replace advice given to you by your health care provider. Make sure you discuss any questions you have with your health care provider.  Document Revised: 02/18/2018 Document Reviewed: 09/17/2016 Elsevier Patient Education  [employer].

## 2019-11-04 NOTE — Assessment & Plan Note (Signed)
Patient has a history of bypass surgery is not having any chest pain right now but he has gained weight.  Also has a problem with claudication of the leg his pulses were found to be okay.

## 2019-11-04 NOTE — Assessment & Plan Note (Signed)
-   I encouraged the patient to lose weight.  - I educated them on making healthy dietary choices including eating more fruits and vegetables and less fried foods. - I encouraged the patient to exercise more, and educated on the benefits of exercise including weight loss, diabetes management, and hypertension management.   

## 2019-11-04 NOTE — Assessment & Plan Note (Signed)
-   Patient experiencing high levels of anxiety.  - Encouraged patient to engage in relaxing activities like yoga, meditation, journaling, going for a walk, or participating in a hobby.  - Encouraged patient to reach out to trusted friends or family members about recent struggles 

## 2019-11-04 NOTE — Progress Notes (Signed)
Established Patient Office Visit  SUBJECTIVE:  Subjective  Patient ID: Richard Burton, male    DOB: 1955-09-11  Age: 64 y.o. MRN: [id number]  CC:  Chief Complaint  Patient presents with   Annual Exam    HPI Richard Burton is a 64 y.o. male presenting today for his annual exam.  Recently he has been working actively outside; he has been putting shingles on the roof of his shed with the help of his wife.   His intermittent urinary stream and postvoid dribbling are stable. He has not followed up with Urology since 2019.   He continues to have issues with his carpel tunnel.   Past Medical History:  Diagnosis Date   Anxiety and depression    a. severe - seeing psychiatry.   CAD (coronary artery disease)    a. s/p 4V CABG 2014  b. 05/2014 MV: No ischemia, EF 69%.   Chronic prostatitis    COPD (chronic obstructive pulmonary disease) (HCC)    ongoing tobacco use   GERD (gastroesophageal reflux disease)    History of DVT (deep vein thrombosis)    Hyperlipidemia    Hyperlipidemia    Hypertension    a. renal arteries patent on remote cath.   Obesity    OSA (obstructive sleep apnea)    severe   PAD (peripheral artery disease) ([place])    a. 01/2014 ABIs R: 0.92, L 1.05.  Know total occlusion of right SFA with distal reconstitution-->Med Rx.   Sleep apnea    Tobacco abuse    a. 05/2014 down to 3-5 cigarettes day and vaping also.    Past Surgical History:  Procedure Laterality Date   CHOLECYSTECTOMY     CORONARY ARTERY BYPASS GRAFT  October 2014   4V   CYSTOSCOPY     KNEE ARTHROSCOPY     right meniscal tear    Family History  Problem Relation Age of Onset   Hypertension Mother    Cataracts Mother    Diabetes Father     Social History   Socioeconomic History   Marital status: Married    Spouse name: Not on file   Number of children: Not on file   Years of education: Not on file   Highest education level: Not on file  Occupational  History   Not on file  Tobacco Use   Smoking status: Former Smoker    Packs/day: 1.00    Years: 40.00    Pack years: 40.00    Types: Cigarettes    Quit date: 08/11/2014    Years since quitting: 5.2   Smokeless tobacco: Never Used  Vaping Use   Vaping Use: Never used  Substance and Sexual Activity   Alcohol use: No    Alcohol/week: 0.0 standard drinks   Drug use: No   Sexual activity: Yes    Birth control/protection: None  Other Topics Concern   Not on file  Social History Narrative   Not on file   Social Determinants of Health   Financial Resource Strain:    Difficulty of Paying Living Expenses:   Food Insecurity:    Worried About Charity fundraiser in the Last Year:    Arboriculturist in the Last Year:   Transportation Needs:    Film/video editor (Medical):    Lack of Transportation (Non-Medical):   Physical Activity:    Days of Exercise per Week:    Minutes of Exercise per Session:   Stress:  Feeling of Stress :   Social Connections:    Frequency of Communication with Friends and Family:    Frequency of Social Gatherings with Friends and Family:    Attends Religious Services:    Active Member of Clubs or Organizations:    Attends Music therapist:    Marital Status:   Intimate Partner Violence:    Fear of Current or Ex-Partner:    Emotionally Abused:    Physically Abused:    Sexually Abused:      Current Outpatient Medications:    ALPRAZolam (XANAX) 0.5 MG tablet, Take 1 tablet (0.5 mg total) by mouth 2 (two) times daily., Disp: 60 tablet, Rfl: 0   aspirin EC 81 MG tablet, Take by mouth., Disp: , Rfl:    atorvastatin (LIPITOR) 80 MG tablet, Take 1 tablet (80 mg total) by mouth daily., Disp: 90 tablet, Rfl: 3   clopidogrel (PLAVIX) 75 MG tablet, Take 1 tablet (75 mg total) by mouth daily., Disp: 90 tablet, Rfl: 3   losartan (COZAAR) 50 MG tablet, Take 50 mg by mouth daily., Disp: , Rfl:    nitroGLYCERIN  (NITROSTAT) 0.4 MG SL tablet, Place 1 tablet (0.4 mg total) under the tongue every 5 (five) minutes as needed for chest pain., Disp: 50 tablet, Rfl: 3   pantoprazole (PROTONIX) 40 MG tablet, Take 1 tablet (40 mg total) by mouth daily., Disp: 30 tablet, Rfl: 6   sertraline (ZOLOFT) 100 MG tablet, TAKE 1 1/2 TABLETS BY MOUTH EVERY DAY, Disp: 45 tablet, Rfl: 0   Allergies  Allergen Reactions   Sulfa Antibiotics Diarrhea   Relafen [Nabumetone] Other (See Comments)    Pt doesn't remember     ROS Review of Systems  Constitutional: Negative.   HENT: Negative.   Eyes: Negative.   Respiratory: Positive for shortness of breath (mild).   Cardiovascular: Negative.  Negative for chest pain.  Gastrointestinal: Negative.  Negative for abdominal pain.  Genitourinary: Negative.  Negative for difficulty urinating.  Musculoskeletal: Negative.        Tendonitis in bilateral achilles heel, Left greater than Right  Skin: Negative.   Allergic/Immunologic: Negative.   Neurological: Negative.  Negative for dizziness.  Hematological: Negative.   Psychiatric/Behavioral: Positive for dysphoric mood (mild - Treated with Zoloft). The patient is nervous/anxious (treated with Zoloft).   All other systems reviewed and are negative.    OBJECTIVE:    Physical Exam Vitals reviewed.  Constitutional:      Appearance: Normal appearance.  Neck:     Vascular: No carotid bruit.  Cardiovascular:     Rate and Rhythm: Normal rate and regular rhythm.     Pulses: Normal pulses.     Heart sounds: Normal heart sounds.  Pulmonary:     Effort: Pulmonary effort is normal.     Breath sounds: Normal breath sounds.  Abdominal:     General: Bowel sounds are normal.     Palpations: Abdomen is soft. There is no hepatomegaly or splenomegaly.     Tenderness: There is no abdominal tenderness.     Hernia: No hernia is present.  Genitourinary:    Prostate: Normal.     Rectum: Normal.  Musculoskeletal:     Right lower  leg: No edema.     Left lower leg: No edema.  Feet:     Right foot:     Protective Sensation: 10 sites tested. 10 sites sensed.     Skin integrity: No skin breakdown or erythema.  Left foot:     Protective Sensation: 10 sites tested. 10 sites sensed.     Skin integrity: No skin breakdown or erythema.  Skin:    Findings: No rash.     Comments: Varicose veins present  Neurological:     Mental Status: He is alert and oriented to person, place, and time.  Psychiatric:        Mood and Affect: Mood normal.        Behavior: Behavior normal.     BP (!) 150/76    Pulse 61    Ht 5\' 11"  (1.803 m)    Wt 263 lb 8 oz (119.5 kg)    BMI 36.75 kg/m  Wt Readings from Last 3 Encounters:  11/04/19 263 lb 8 oz (119.5 kg)  06/13/19 266 lb 3.2 oz (120.7 kg)  03/18/19 258 lb 6.4 oz (117.2 kg)    Health Maintenance Due  Topic Date Due   Hepatitis C Screening  Never done   COVID-19 Vaccine (1) Never done   HIV Screening  Never done   COLONOSCOPY  Never done   TETANUS/TDAP  12/19/2014    There are no preventive care reminders to display for this patient.  CBC Latest Ref Rng & Units 05/19/2014 05/19/2014 03/16/2014  WBC 4.0 - 10.5 K/uL 7.3 8.2 7.9  Hemoglobin 13.0 - 17.0 g/dL 15.9 16.7 15.4  Hematocrit 39 - 52 % 45.2 48.6 47.0  Platelets 150 - 400 K/uL 188 206 195   CMP Latest Ref Rng & Units 10/26/2015 05/19/2014 05/19/2014  Glucose 70 - 99 mg/dL - - 97  BUN 6 - 23 mg/dL - - 12  Creatinine 0.50 - 1.35 mg/dL - 1.07 1.14  Sodium 135 - 145 mmol/L - - 140  Potassium 3.5 - 5.1 mmol/L - - 4.5  Chloride 96 - 112 mEq/L - - 104  CO2 19 - 32 mmol/L - - 22  Calcium 8.4 - 10.5 mg/dL - - 9.5  Total Protein 6.1 - 8.1 g/dL 7.1 - -  Total Bilirubin 0.2 - 1.2 mg/dL 0.6 - -  Alkaline Phos 40 - 115 U/L 69 - -  AST 10 - 35 U/L 19 - -  ALT 9 - 46 U/L 16 - -    Lab Results  Component Value Date   TSH 0.720 03/16/2014   Lab Results  Component Value Date   ALBUMIN 4.3 10/26/2015   ANIONGAP 14  05/19/2014   Lab Results  Component Value Date   CHOL 153 10/26/2015   CHOL 100 03/16/2014   CHOL 187 12/12/2008   HDL 51 10/26/2015   HDL 31 (L) 03/16/2014   HDL 31.20 (L) 12/12/2008   LDLCALC 89 10/26/2015   LDLCALC 46 03/16/2014   CHOLHDL 3.0 10/26/2015   CHOLHDL 6 12/12/2008   CHOLHDL 7.0 CALC 12/06/2007   Lab Results  Component Value Date   TRIG 65 10/26/2015   Lab Results  Component Value Date   HGBA1C 5.2 02/04/2006      ASSESSMENT & PLAN:   Problem List Items Addressed This Visit      Cardiovascular and Mediastinum   Essential hypertension    .- I encouraged the patient to lose weight.  - I educated them on making healthy dietary choices including eating more fruits and vegetables and less fried foods. - I encouraged the patient to exercise more, and educated on the benefits of exercise including weight loss, diabetes management, and hypertension management.        CAD (coronary  artery disease)    Patient has a history of bypass surgery is not having any chest pain right now but he has gained weight.  Also has a problem with claudication of the leg his pulses were found to be okay.        PAD (peripheral artery disease) ([place])    Ref to vascular specialist        Other   Anxiety - Primary    - Patient experiencing high levels of anxiety.  - Encouraged patient to engage in relaxing activities like yoga, meditation, journaling, going for a walk, or participating in a hobby.  - Encouraged patient to reach out to trusted friends or family members about recent struggles      Relevant Medications   ALPRAZolam (XANAX) 0.5 MG tablet   Annual physical exam    Patient was advised to lose weight walk on a daily basis.  Prostate examination is normal.  Diabetic foot examination is normal.         Meds ordered this encounter  Medications   ALPRAZolam (XANAX) 0.5 MG tablet    Sig: Take 1 tablet (0.5 mg total) by mouth 2 (two) times daily.    Dispense:   60 tablet    Refill:  0    Anxiety - Plan: ALPRAZolam (XANAX) 0.5 MG tablet  Essential hypertension  PAD (peripheral artery disease) (HCC)  Coronary artery disease involving autologous vein coronary bypass graft without angina pectoris  Annual physical exam   Follow-up: Return in about 4 weeks (around 12/02/2019).    Dr. Jane Canary [facility] [address] [place], [place] [address]   By signing my name below, I, [employer], attest that this documentation has been prepared under the direction and in the presence of Cletis Athens, MD. Electronically Signed: Cletis Athens, MD 11/04/19, 4:51 PM   I personally performed the services described in this documentation, which was SCRIBED in my presence. The recorded information has been reviewed and considered accurate. It has been edited as necessary during review. Cletis Athens, MD

## 2019-11-04 NOTE — Assessment & Plan Note (Signed)
Lose wt 

## 2019-11-04 NOTE — Assessment & Plan Note (Signed)
Patient was advised to lose weight walk on a daily basis.  Prostate examination is normal.  Diabetic foot examination is normal.

## 2019-11-30 ENCOUNTER — Other Ambulatory Visit: Payer: Self-pay

## 2019-11-30 ENCOUNTER — Other Ambulatory Visit (INDEPENDENT_AMBULATORY_CARE_PROVIDER_SITE_OTHER): Payer: BC Managed Care – PPO

## 2019-11-30 DIAGNOSIS — Z0000 Encounter for general adult medical examination without abnormal findings: Secondary | ICD-10-CM | POA: Diagnosis not present

## 2019-12-01 LAB — LIPID PANEL
Cholesterol: 205 mg/dL — ABNORMAL HIGH (ref <200)
HDL: 44 mg/dL (ref >=40)
LDL Cholesterol (Calc): 134 mg/dL (calc) — ABNORMAL HIGH
Non-HDL Cholesterol (Calc): 161 mg/dL (calc) — ABNORMAL HIGH (ref <130)
Total CHOL/HDL Ratio: 4.7 (calc) (ref <5.0)
Triglycerides: 148 mg/dL (ref <150)

## 2019-12-01 LAB — CBC WITH DIFFERENTIAL/PLATELET
Absolute Monocytes: 560 cells/uL (ref 200–950)
Basophils Absolute: 42 cells/uL (ref 0–200)
Basophils Relative: 0.6 %
Eosinophils Absolute: 154 cells/uL (ref 15–500)
Eosinophils Relative: 2.2 %
HCT: 47.6 % (ref 38.5–50.0)
Hemoglobin: 16.2 g/dL (ref 13.2–17.1)
Lymphs Abs: 1407 cells/uL (ref 850–3900)
MCH: 30.5 pg (ref 27.0–33.0)
MCHC: 34 g/dL (ref 32.0–36.0)
MCV: 89.5 fL (ref 80.0–100.0)
MPV: 11 fL (ref 7.5–12.5)
Monocytes Relative: 8 %
Neutro Abs: 4837 cells/uL (ref 1500–7800)
Neutrophils Relative %: 69.1 %
Platelets: 217 10*3/uL (ref 140–400)
RBC: 5.32 10*6/uL (ref 4.20–5.80)
RDW: 13.1 % (ref 11.0–15.0)
Total Lymphocyte: 20.1 %
WBC: 7 10*3/uL (ref 3.8–10.8)

## 2019-12-01 LAB — COMPLETE METABOLIC PANEL WITH GFR
AG Ratio: 1.6 (calc) (ref 1.0–2.5)
ALT: 31 U/L (ref 9–46)
AST: 33 U/L (ref 10–35)
Albumin: 4.5 g/dL (ref 3.6–5.1)
Alkaline phosphatase (APISO): 73 U/L (ref 35–144)
BUN: 16 mg/dL (ref 7–25)
CO2: 21 mmol/L (ref 20–32)
Calcium: 9.7 mg/dL (ref 8.6–10.3)
Chloride: 104 mmol/L (ref 98–110)
Creat: 1.06 mg/dL (ref 0.70–1.25)
GFR, Est African American: 87 mL/min/{1.73_m2} (ref >=60)
GFR, Est Non African American: 75 mL/min/{1.73_m2} (ref >=60)
Globulin: 2.8 g/dL (calc) (ref 1.9–3.7)
Glucose, Bld: 135 mg/dL — ABNORMAL HIGH (ref 65–99)
Potassium: 5.4 mmol/L — ABNORMAL HIGH (ref 3.5–5.3)
Sodium: 142 mmol/L (ref 135–146)
Total Bilirubin: 0.4 mg/dL (ref 0.2–1.2)
Total Protein: 7.3 g/dL (ref 6.1–8.1)

## 2019-12-01 LAB — PSA: PSA: 0.4 ng/mL (ref <=4.0)

## 2019-12-01 LAB — TSH: TSH: 1.9 mIU/L (ref 0.40–4.50)

## 2019-12-02 ENCOUNTER — Other Ambulatory Visit: Payer: Self-pay

## 2019-12-02 ENCOUNTER — Ambulatory Visit (INDEPENDENT_AMBULATORY_CARE_PROVIDER_SITE_OTHER): Payer: BC Managed Care – PPO | Admitting: Internal Medicine

## 2019-12-02 ENCOUNTER — Encounter: Payer: Self-pay | Admitting: Internal Medicine

## 2019-12-02 VITALS — BP 154/79 | HR 65 | Ht 70.0 in | Wt 265.5 lb

## 2019-12-02 DIAGNOSIS — R8290 Unspecified abnormal findings in urine: Secondary | ICD-10-CM

## 2019-12-02 DIAGNOSIS — I10 Essential (primary) hypertension: Secondary | ICD-10-CM | POA: Diagnosis not present

## 2019-12-02 DIAGNOSIS — G629 Polyneuropathy, unspecified: Secondary | ICD-10-CM

## 2019-12-02 DIAGNOSIS — M545 Low back pain, unspecified: Secondary | ICD-10-CM

## 2019-12-02 DIAGNOSIS — Z6841 Body Mass Index (BMI) 40.0 and over, adult: Secondary | ICD-10-CM

## 2019-12-02 DIAGNOSIS — G8929 Other chronic pain: Secondary | ICD-10-CM

## 2019-12-02 DIAGNOSIS — J449 Chronic obstructive pulmonary disease, unspecified: Secondary | ICD-10-CM

## 2019-12-02 DIAGNOSIS — E662 Morbid (severe) obesity with alveolar hypoventilation: Secondary | ICD-10-CM | POA: Diagnosis not present

## 2019-12-02 DIAGNOSIS — R42 Dizziness and giddiness: Secondary | ICD-10-CM

## 2019-12-02 LAB — POCT URINALYSIS DIPSTICK
Bilirubin, UA: NEGATIVE
Blood, UA: NEGATIVE
Glucose, UA: NEGATIVE
Ketones, UA: POSITIVE
Leukocytes, UA: NEGATIVE
Nitrite, UA: NEGATIVE
Protein, UA: POSITIVE — AB
Spec Grav, UA: 1.015 (ref 1.010–1.025)
Urobilinogen, UA: NEGATIVE E.U./dL — AB
pH, UA: 6.5 (ref 5.0–8.0)

## 2019-12-02 NOTE — Assessment & Plan Note (Signed)
The patient was advised to lose weight and walk on a daily basis.

## 2019-12-02 NOTE — Assessment & Plan Note (Signed)
The patient has stopped smoking, but smoked one pack per day for thirty years. This is contributing to his shortness of breath.

## 2019-12-02 NOTE — Assessment & Plan Note (Signed)
Dizziness appears to be secondary to neuropathy. His ENT examination was unremarkable. There were no bruit in the carotid. EKG showed sinus bradycardia.

## 2019-12-02 NOTE — Progress Notes (Signed)
Established Patient Office Visit  SUBJECTIVE:  Subjective  Patient ID: Richard Burton, male    DOB: [birth year]  Age: 64 y.o. MRN: [id number]  CC:  Chief Complaint  Patient presents with   lab results   Back Pain    patient complains of having back pain and pain that feels like is from his kidneys, also has noticed strong, foul odor in his urine    Anxiety    needs refill of xanax     HPI Richard Burton is a 64 y.o. male presenting today to discuss labs that were drawn on 11/30/2019. The patient had a PSA, TSH, lipid panel, CMP, and CBC with differential/platelet. Results were all within normal limits with the exception of mildly elevated blood glucose and cholesterol. He reports missing a few doses of his cholesterol medication.  At this time, the patient reports dizziness and feeling off balance early in the mornings, which began approximately four days ago. He also reports sinus congestion, peripheral neuropathy, and bilateral low back pain that is worse on the right. He denies trouble urinating, loss of consciousness, changes in memory, and chest pain.  The patient last saw his cardiologist approximately six months ago.  Past Medical History:  Diagnosis Date   Anxiety and depression    a. severe - seeing psychiatry.   CAD (coronary artery disease)    a. s/p 4V CABG 2014  b. 05/2014 MV: No ischemia, EF 69%.   Chronic prostatitis    COPD (chronic obstructive pulmonary disease) (HCC)    ongoing tobacco use   GERD (gastroesophageal reflux disease)    History of DVT (deep vein thrombosis)    Hyperlipidemia    Hyperlipidemia    Hypertension    a. renal arteries patent on remote cath.   Obesity    OSA (obstructive sleep apnea)    severe   PAD (peripheral artery disease) ([place])    a. 01/2014 ABIs R: 0.92, L 1.05.  Know total occlusion of right SFA with distal reconstitution-->Med Rx.   Sleep apnea    Tobacco abuse    a. 05/2014 down to 3-5 cigarettes day  and vaping also.    Past Surgical History:  Procedure Laterality Date   CHOLECYSTECTOMY     CORONARY ARTERY BYPASS GRAFT  October 2014   4V   CYSTOSCOPY     KNEE ARTHROSCOPY     right meniscal tear    Family History  Problem Relation Age of Onset   Hypertension Mother    Cataracts Mother    Diabetes Father     Social History   Socioeconomic History   Marital status: Married    Spouse name: Not on file   Number of children: Not on file   Years of education: Not on file   Highest education level: Not on file  Occupational History   Not on file  Tobacco Use   Smoking status: Former Smoker    Packs/day: 1.00    Years: 40.00    Pack years: 40.00    Types: Cigarettes    Quit date: 08/11/2014    Years since quitting: 5.3   Smokeless tobacco: Never Used  Vaping Use   Vaping Use: Never used  Substance and Sexual Activity   Alcohol use: No    Alcohol/week: 0.0 standard drinks   Drug use: No   Sexual activity: Yes    Birth control/protection: None  Other Topics Concern   Not on file  Social History Narrative  Not on file   Social Determinants of Health   Financial Resource Strain:    Difficulty of Paying Living Expenses:   Food Insecurity:    Worried About Charity fundraiser in the Last Year:    Arboriculturist in the Last Year:   Transportation Needs:    Film/video editor (Medical):    Lack of Transportation (Non-Medical):   Physical Activity:    Days of Exercise per Week:    Minutes of Exercise per Session:   Stress:    Feeling of Stress :   Social Connections:    Frequency of Communication with Friends and Family:    Frequency of Social Gatherings with Friends and Family:    Attends Religious Services:    Active Member of Clubs or Organizations:    Attends Music therapist:    Marital Status:   Intimate Partner Violence:    Fear of Current or Ex-Partner:    Emotionally Abused:    Physically  Abused:    Sexually Abused:      Current Outpatient Medications:    ALPRAZolam (XANAX) 0.5 MG tablet, Take 1 tablet (0.5 mg total) by mouth 2 (two) times daily., Disp: 60 tablet, Rfl: 0   aspirin EC 81 MG tablet, Take by mouth., Disp: , Rfl:    atorvastatin (LIPITOR) 80 MG tablet, Take 1 tablet (80 mg total) by mouth daily., Disp: 90 tablet, Rfl: 3   clopidogrel (PLAVIX) 75 MG tablet, Take 1 tablet (75 mg total) by mouth daily., Disp: 90 tablet, Rfl: 3   losartan (COZAAR) 50 MG tablet, Take 50 mg by mouth daily., Disp: , Rfl:    nitroGLYCERIN (NITROSTAT) 0.4 MG SL tablet, Place 1 tablet (0.4 mg total) under the tongue every 5 (five) minutes as needed for chest pain., Disp: 50 tablet, Rfl: 3   pantoprazole (PROTONIX) 40 MG tablet, Take 1 tablet (40 mg total) by mouth daily., Disp: 30 tablet, Rfl: 6   sertraline (ZOLOFT) 100 MG tablet, TAKE 1 1/2 TABLETS BY MOUTH EVERY DAY, Disp: 45 tablet, Rfl: 0   Allergies  Allergen Reactions   Sulfa Antibiotics Diarrhea   Relafen [Nabumetone] Other (See Comments)    Pt doesn't remember     ROS Review of Systems  Constitutional: Negative.   HENT: Positive for congestion.   Eyes: Negative.   Respiratory: Negative.   Cardiovascular: Negative.   Gastrointestinal: Negative.   Endocrine: Negative.   Genitourinary: Negative.   Musculoskeletal: Positive for back pain.  Skin: Negative.   Allergic/Immunologic: Negative.   Neurological: Positive for dizziness.       Reports feeling off balance and peripheral neuropathy.  Denies loss of consciousness and changes in memory.  Hematological: Negative.   Psychiatric/Behavioral: Negative.   All other systems reviewed and are negative.    OBJECTIVE:    Physical Exam Vitals reviewed.  Constitutional:      Appearance: Normal appearance.  HENT:     Left Ear: Tympanic membrane and ear canal normal.     Ears:     Comments: Right TM is slightly erythematous    Mouth/Throat:     Mouth:  Mucous membranes are moist.  Eyes:     Pupils: Pupils are equal, round, and reactive to light.  Neck:     Vascular: No carotid bruit.  Cardiovascular:     Rate and Rhythm: Normal rate and regular rhythm.     Pulses: Normal pulses.     Heart sounds: Normal heart sounds.  Pulmonary:     Effort: Pulmonary effort is normal.     Breath sounds: Normal breath sounds.  Abdominal:     General: Bowel sounds are normal.     Palpations: Abdomen is soft. There is no hepatomegaly, splenomegaly or mass.     Tenderness: There is no abdominal tenderness.     Hernia: No hernia is present.  Musculoskeletal:     Cervical back: Neck supple.     Right lower leg: No edema.     Left lower leg: No edema.  Skin:    Findings: No rash.  Neurological:     Mental Status: He is alert and oriented to person, place, and time.     Motor: No weakness.  Psychiatric:        Mood and Affect: Mood normal.        Behavior: Behavior normal.     BP (!) 154/79    Pulse 65    Ht 5\' 10"  (1.778 m)    Wt (!) 265 lb 8 oz (120.4 kg)    BMI 38.10 kg/m  Wt Readings from Last 3 Encounters:  12/02/19 (!) 265 lb 8 oz (120.4 kg)  11/04/19 263 lb 8 oz (119.5 kg)  06/13/19 266 lb 3.2 oz (120.7 kg)    Health Maintenance Due  Topic Date Due   Hepatitis C Screening  Never done   COVID-19 Vaccine (1) Never done   HIV Screening  Never done   COLONOSCOPY  Never done   TETANUS/TDAP  12/19/2014    There are no preventive care reminders to display for this patient.  CBC Latest Ref Rng & Units 11/30/2019 05/19/2014 05/19/2014  WBC 3.8 - 10.8 Thousand/uL 7.0 7.3 8.2  Hemoglobin 13.2 - 17.1 g/dL 16.2 15.9 16.7  Hematocrit 38 - 50 % 47.6 45.2 48.6  Platelets 140 - 400 Thousand/uL 217 188 206   CMP Latest Ref Rng & Units 11/30/2019 10/26/2015 05/19/2014  Glucose 65 - 99 mg/dL 135(H) - -  BUN 7 - 25 mg/dL 16 - -  Creatinine 0.70 - 1.25 mg/dL 1.06 - 1.07  Sodium 135 - 146 mmol/L 142 - -  Potassium 3.5 - 5.3 mmol/L 5.4(H) - -    Chloride 98 - 110 mmol/L 104 - -  CO2 20 - 32 mmol/L 21 - -  Calcium 8.6 - 10.3 mg/dL 9.7 - -  Total Protein 6.1 - 8.1 g/dL 7.3 7.1 -  Total Bilirubin 0.2 - 1.2 mg/dL 0.4 0.6 -  Alkaline Phos 40 - 115 U/L - 69 -  AST 10 - 35 U/L 33 19 -  ALT 9 - 46 U/L 31 16 -    Lab Results  Component Value Date   TSH 1.90 11/30/2019   Lab Results  Component Value Date   ALBUMIN 4.3 10/26/2015   ANIONGAP 14 05/19/2014   Lab Results  Component Value Date   CHOL 205 (H) 11/30/2019   CHOL 153 10/26/2015   CHOL 100 03/16/2014   HDL 44 11/30/2019   HDL 51 10/26/2015   HDL 31 (L) 03/16/2014   LDLCALC 134 (H) 11/30/2019   LDLCALC 89 10/26/2015   LDLCALC 46 03/16/2014   CHOLHDL 4.7 11/30/2019   CHOLHDL 3.0 10/26/2015   CHOLHDL 6 12/12/2008   Lab Results  Component Value Date   TRIG 148 11/30/2019   Lab Results  Component Value Date   HGBA1C 5.2 02/04/2006      ASSESSMENT & PLAN:   Problem List Items Addressed This Visit  Cardiovascular and Mediastinum   Hypertension    Blood pressure is under control with Losartan.         Respiratory   COPD (chronic obstructive pulmonary disease) ([place])    The patient has stopped smoking, but smoked one pack per day for thirty years. This is contributing to his shortness of breath.        Nervous and Auditory   Neuropathy    The patient has neuropathy of bilateral legs and varicose veins. This may be contributing to the dizziness, especially in the morning.        Other   Chronic bilateral low back pain    Suggested back exercises and losing weight.      Obesity    The patient was advised to lose weight and walk on a daily basis.       Dizziness    Dizziness appears to be secondary to neuropathy. His ENT examination was unremarkable. There were no bruit in the carotid. EKG showed sinus bradycardia.       Other Visit Diagnoses    Foul smelling urine    -  Primary   Relevant Orders   POCT urinalysis dipstick (Completed)       No orders of the defined types were placed in this encounter.   Follow-up: No follow-ups on file.   EKG today showed normal sinus rhythm with a LAHB and incomplete RBBB. ST-T changes are non-specific.  Dr. Jane Canary [facility] [address], [place], [place] [address]   By signing my name below, I, [employer], attest that this documentation has been prepared under the direction and in the presence of Cletis Athens, MD. Electronically Signed: Cletis Athens, MD 12/02/19, 4:12 PM   I personally performed the services described in this documentation, which was SCRIBED in my presence. The recorded information has been reviewed and considered accurate. It has been edited as necessary during review. Cletis Athens, MD

## 2019-12-02 NOTE — Assessment & Plan Note (Signed)
Blood pressure is under control with Losartan.

## 2019-12-02 NOTE — Assessment & Plan Note (Signed)
Suggested back exercises and losing weight.

## 2019-12-02 NOTE — Assessment & Plan Note (Signed)
The patient has neuropathy of bilateral legs and varicose veins. This may be contributing to the dizziness, especially in the morning.

## 2019-12-06 ENCOUNTER — Other Ambulatory Visit: Payer: Self-pay | Admitting: Internal Medicine

## 2019-12-12 ENCOUNTER — Other Ambulatory Visit: Payer: Self-pay

## 2019-12-12 ENCOUNTER — Ambulatory Visit (INDEPENDENT_AMBULATORY_CARE_PROVIDER_SITE_OTHER): Payer: BC Managed Care – PPO | Admitting: Internal Medicine

## 2019-12-12 ENCOUNTER — Encounter: Payer: Self-pay | Admitting: Internal Medicine

## 2019-12-12 VITALS — BP 149/84 | HR 59 | Ht 72.0 in | Wt 263.3 lb

## 2019-12-12 DIAGNOSIS — N3090 Cystitis, unspecified without hematuria: Secondary | ICD-10-CM | POA: Insufficient documentation

## 2019-12-12 DIAGNOSIS — N23 Unspecified renal colic: Secondary | ICD-10-CM | POA: Diagnosis not present

## 2019-12-12 DIAGNOSIS — R319 Hematuria, unspecified: Secondary | ICD-10-CM

## 2019-12-12 DIAGNOSIS — N410 Acute prostatitis: Secondary | ICD-10-CM | POA: Diagnosis not present

## 2019-12-12 DIAGNOSIS — I10 Essential (primary) hypertension: Secondary | ICD-10-CM

## 2019-12-12 LAB — POCT URINALYSIS DIPSTICK
Bilirubin, UA: NEGATIVE
Blood, UA: POSITIVE
Glucose, UA: NEGATIVE
Ketones, UA: NEGATIVE
Leukocytes, UA: NEGATIVE
Nitrite, UA: NEGATIVE
Protein, UA: POSITIVE — AB
Spec Grav, UA: >=1.03 — AB (ref 1.010–1.025)
Urobilinogen, UA: 0.2 E.U./dL
pH, UA: 5.5 (ref 5.0–8.0)

## 2019-12-12 MED ORDER — CIPROFLOXACIN HCL 500 MG PO TABS: 500.0000 mg | ORAL_TABLET | Freq: Two times a day (BID) | ORAL | 0 refills | Status: DC

## 2019-12-12 MED ORDER — TRAMADOL HCL 50 MG PO TABS: 50.0000 mg | ORAL_TABLET | Freq: Two times a day (BID) | ORAL | 0 refills | Status: DC

## 2019-12-12 NOTE — Assessment & Plan Note (Signed)
The patient has a history of interstitial cystitis. Will order a urine culture to make sure that he does not have a bacterial cystitis. We will also start him on a course of Cipro. I will refer him to a urologist for further evaluation of bladder pain. His prostate was checked last week and was not tender.

## 2019-12-12 NOTE — Assessment & Plan Note (Signed)
The patient complained of bilateral flank pain that radiated to the groin. He will need further testing from the urologist. In the meantime, we will continue Cipro.

## 2019-12-12 NOTE — Addendum Note (Signed)
Addended by: Alois Cliche on: 12/12/2019 04:20 PM   Modules accepted: Orders

## 2019-12-12 NOTE — Progress Notes (Signed)
Established Patient Office Visit  SUBJECTIVE:  Subjective  Patient ID: Richard Burton, male    DOB: 06/17/1955  Age: 64 y.o. MRN: [id number]  CC:  Chief Complaint  Patient presents with  . Pyelonephritis    patient having a lot of pain in back and kidney     HPI Richard Burton is a 64 y.o. male presenting today with complaint of constant but worsening, aching, bilateral flank pain for the last week. Pain radiates to groin and is exacerbated when bending over or sitting for long periods of time. He has been taking Ibuprofen with mild relief. He denies fever, chills, dysuria, and urinary urgency.  The patient states that he has not seen a urologist in "a while". No known history of kidney stones. However, he does have a history of interstitial cystitis for the last 30 years.  Past Medical History:  Diagnosis Date  . Anxiety and depression    a. severe - seeing psychiatry.  Marland Kitchen CAD (coronary artery disease)    a. s/p 4V CABG 2014  b. 05/2014 MV: No ischemia, EF 69%.  . Chronic prostatitis   . COPD (chronic obstructive pulmonary disease) ([facility])    ongoing tobacco use  . GERD (gastroesophageal reflux disease)   . History of DVT (deep vein thrombosis)   . Hyperlipidemia   . Hyperlipidemia   . Hypertension    a. renal arteries patent on remote cath.  . Obesity   . OSA (obstructive sleep apnea)    severe  . PAD (peripheral artery disease) ([facility])    a. 01/2014 ABIs R: 0.92, L 1.05.  Know total occlusion of right SFA with distal reconstitution-->Med Rx.  . Sleep apnea   . Tobacco abuse    a. 05/2014 down to 3-5 cigarettes day and vaping also.    Past Surgical History:  Procedure Laterality Date  . CHOLECYSTECTOMY    . CORONARY ARTERY BYPASS GRAFT  October 2014   4V  . CYSTOSCOPY    . KNEE ARTHROSCOPY     right meniscal tear    Family History  Problem Relation Age of Onset  . Hypertension Mother   . Cataracts Mother   . Diabetes Father     Social History    Socioeconomic History  . Marital status: Married    Spouse name: Not on file  . Number of children: Not on file  . Years of education: Not on file  . Highest education level: Not on file  Occupational History  . Not on file  Tobacco Use  . Smoking status: Former Smoker    Packs/day: 1.00    Years: 40.00    Pack years: 40.00    Types: Cigarettes    Quit date: 08/11/2014    Years since quitting: 5.3  . Smokeless tobacco: Never Used  Vaping Use  . Vaping Use: Never used  Substance and Sexual Activity  . Alcohol use: No    Alcohol/week: 0.0 standard drinks  . Drug use: No  . Sexual activity: Yes    Birth control/protection: None  Other Topics Concern  . Not on file  Social History Narrative  . Not on file   Social Determinants of Health   Financial Resource Strain:   . Difficulty of Paying Living Expenses:   Food Insecurity:   . Worried About Charity fundraiser in the Last Year:   . Arboriculturist in the Last Year:   Transportation Needs:   . Lack of Transportation (  Medical):   Marland Kitchen Lack of Transportation (Non-Medical):   Physical Activity:   . Days of Exercise per Week:   . Minutes of Exercise per Session:   Stress:   . Feeling of Stress :   Social Connections:   . Frequency of Communication with Friends and Family:   . Frequency of Social Gatherings with Friends and Family:   . Attends Religious Services:   . Active Member of Clubs or Organizations:   . Attends Archivist Meetings:   Marland Kitchen Marital Status:   Intimate Partner Violence:   . Fear of Current or Ex-Partner:   . Emotionally Abused:   Marland Kitchen Physically Abused:   . Sexually Abused:      Current Outpatient Medications:  .  aspirin EC 81 MG tablet, Take by mouth., Disp: , Rfl:  .  atorvastatin (LIPITOR) 80 MG tablet, Take 1 tablet (80 mg total) by mouth daily., Disp: 90 tablet, Rfl: 3 .  ciprofloxacin (CIPRO) 500 MG tablet, Take 1 tablet (500 mg total) by mouth 2 (two) times daily., Disp: 20  tablet, Rfl: 0 .  clopidogrel (PLAVIX) 75 MG tablet, Take 1 tablet (75 mg total) by mouth daily., Disp: 90 tablet, Rfl: 3 .  losartan (COZAAR) 50 MG tablet, Take 50 mg by mouth daily., Disp: , Rfl:  .  nitroGLYCERIN (NITROSTAT) 0.4 MG SL tablet, Place 1 tablet (0.4 mg total) under the tongue every 5 (five) minutes as needed for chest pain., Disp: 50 tablet, Rfl: 3 .  pantoprazole (PROTONIX) 40 MG tablet, Take 1 tablet (40 mg total) by mouth daily., Disp: 30 tablet, Rfl: 6 .  sertraline (ZOLOFT) 100 MG tablet, TAKE 1 1/2 TABLETS BY MOUTH EVERY DAY, Disp: 45 tablet, Rfl: 0 .  traMADol (ULTRAM) 50 MG tablet, Take 1 tablet (50 mg total) by mouth 2 (two) times daily for 15 days., Disp: 30 tablet, Rfl: 0   Allergies  Allergen Reactions  . Sulfa Antibiotics Diarrhea  . Relafen [Nabumetone] Other (See Comments)    Pt doesn't remember     ROS Review of Systems  Constitutional: Negative for chills and fever.  HENT: Negative.   Eyes: Negative.   Respiratory: Negative.   Cardiovascular: Negative.   Gastrointestinal: Negative.   Endocrine: Negative.   Genitourinary: Positive for flank pain. Negative for dysuria and urgency.       Reports bilateral flank pain that radiates to groin  Skin: Negative.   Allergic/Immunologic: Negative.   Neurological: Negative.   Hematological: Negative.   Psychiatric/Behavioral: Negative.   All other systems reviewed and are negative.    OBJECTIVE:    Physical Exam Vitals reviewed.  Constitutional:      Appearance: Normal appearance.  HENT:     Mouth/Throat:     Mouth: Mucous membranes are moist.  Eyes:     Pupils: Pupils are equal, round, and reactive to light.  Neck:     Vascular: No carotid bruit.  Cardiovascular:     Rate and Rhythm: Normal rate and regular rhythm.     Pulses: Normal pulses.     Heart sounds: Normal heart sounds.  Pulmonary:     Effort: Pulmonary effort is normal.     Breath sounds: Normal breath sounds.  Abdominal:      General: Bowel sounds are normal.     Palpations: Abdomen is soft. There is no hepatomegaly, splenomegaly or mass.     Tenderness: There is no abdominal tenderness.     Hernia: No hernia is present.  Musculoskeletal:     Cervical back: Neck supple.     Right lower leg: No edema.     Left lower leg: No edema.     Comments: Bilateral flank tenderness to palpation  Skin:    Findings: No rash.  Neurological:     Mental Status: He is alert and oriented to person, place, and time.     Motor: No weakness.  Psychiatric:        Mood and Affect: Mood normal.        Behavior: Behavior normal.     BP (!) 149/84   Pulse (!) 59   Ht 6' (1.829 m)   Wt 263 lb 4.8 oz (119.4 kg)   BMI 35.71 kg/m  Wt Readings from Last 3 Encounters:  12/12/19 263 lb 4.8 oz (119.4 kg)  12/02/19 (!) 265 lb 8 oz (120.4 kg)  11/04/19 263 lb 8 oz (119.5 kg)    Health Maintenance Due  Topic Date Due  . Hepatitis C Screening  Never done  . COVID-19 Vaccine (1) Never done  . HIV Screening  Never done  . COLONOSCOPY  Never done  . TETANUS/TDAP  12/19/2014  . INFLUENZA VACCINE  12/11/2019    There are no preventive care reminders to display for this patient.  CBC Latest Ref Rng & Units 11/30/2019 05/19/2014 05/19/2014  WBC 3.8 - 10.8 Thousand/uL 7.0 7.3 8.2  Hemoglobin 13.2 - 17.1 g/dL 16.2 15.9 16.7  Hematocrit 38 - 50 % 47.6 45.2 48.6  Platelets 140 - 400 Thousand/uL 217 188 206   CMP Latest Ref Rng & Units 11/30/2019 10/26/2015 05/19/2014  Glucose 65 - 99 mg/dL 135(H) - -  BUN 7 - 25 mg/dL 16 - -  Creatinine 0.70 - 1.25 mg/dL 1.06 - 1.07  Sodium 135 - 146 mmol/L 142 - -  Potassium 3.5 - 5.3 mmol/L 5.4(H) - -  Chloride 98 - 110 mmol/L 104 - -  CO2 20 - 32 mmol/L 21 - -  Calcium 8.6 - 10.3 mg/dL 9.7 - -  Total Protein 6.1 - 8.1 g/dL 7.3 7.1 -  Total Bilirubin 0.2 - 1.2 mg/dL 0.4 0.6 -  Alkaline Phos 40 - 115 U/L - 69 -  AST 10 - 35 U/L 33 19 -  ALT 9 - 46 U/L 31 16 -    Lab Results  Component Value Date    TSH 1.90 11/30/2019   Lab Results  Component Value Date   ALBUMIN 4.3 10/26/2015   ANIONGAP 14 05/19/2014   Lab Results  Component Value Date   CHOL 205 (H) 11/30/2019   CHOL 153 10/26/2015   CHOL 100 03/16/2014   HDL 44 11/30/2019   HDL 51 10/26/2015   HDL 31 (L) 03/16/2014   LDLCALC 134 (H) 11/30/2019   LDLCALC 89 10/26/2015   LDLCALC 46 03/16/2014   CHOLHDL 4.7 11/30/2019   CHOLHDL 3.0 10/26/2015   CHOLHDL 6 12/12/2008   Lab Results  Component Value Date   TRIG 148 11/30/2019   Lab Results  Component Value Date   HGBA1C 5.2 02/04/2006      ASSESSMENT & PLAN:   Problem List Items Addressed This Visit      Cardiovascular and Mediastinum   Essential hypertension    - Today, the patient's blood pressure is well managed on Losartan. - The patient will continue the current treatment regimen.  - I encouraged the patient to eat a low-sodium diet to help control blood pressure. - I encouraged the patient  to live an active lifestyle and complete activities that increases heart rate to 85% target heart rate at least 5 times per week for one hour.            Genitourinary   Prostatitis - Primary    Will do a urine culture and start him on Cipro.      Relevant Medications   ciprofloxacin (CIPRO) 500 MG tablet   Cystitis    The patient has a history of interstitial cystitis. Will order a urine culture to make sure that he does not have a bacterial cystitis. We will also start him on a course of Cipro. I will refer him to a urologist for further evaluation of bladder pain. His prostate was checked last week and was not tender.        Other   Kidney pain    The patient complained of bilateral flank pain that radiated to the groin. He will need further testing from the urologist. In the meantime, we will continue Cipro.       Relevant Orders   POCT urinalysis dipstick (Completed)   Hematuria    Most likely secondary to cystitis. Will do a urine culture and put  him on Cipro.          Meds ordered this encounter  Medications  . ciprofloxacin (CIPRO) 500 MG tablet    Sig: Take 1 tablet (500 mg total) by mouth 2 (two) times daily.    Dispense:  20 tablet    Refill:  0  . traMADol (ULTRAM) 50 MG tablet    Sig: Take 1 tablet (50 mg total) by mouth 2 (two) times daily for 15 days.    Dispense:  30 tablet    Refill:  0     Follow-up: Return in about 1 week (around 12/19/2019).   Consider interstitial cystitis vs prostatitis. Recommended follow-up with urologist.  Dr. Jane Canary [facility] [address] [place], [place] [address]   By signing my name below, I, Clerance Lav, attest that this documentation has been prepared under the direction and in the presence of Cletis Athens, MD. Electronically Signed: Cletis Athens, MD 12/12/19, 12:57 PM   I personally performed the services described in this documentation, which was SCRIBED in my presence. The recorded information has been reviewed and considered accurate. It has been edited as necessary during review. Cletis Athens, MD

## 2019-12-12 NOTE — Assessment & Plan Note (Signed)
Most likely secondary to cystitis. Will do a urine culture and put him on Cipro.

## 2019-12-12 NOTE — Assessment & Plan Note (Signed)
-   Today, the patient's blood pressure is well managed on Losartan. - The patient will continue the current treatment regimen.  - I encouraged the patient to eat a low-sodium diet to help control blood pressure. - I encouraged the patient to live an active lifestyle and complete activities that increases heart rate to 85% target heart rate at least 5 times per week for one hour.

## 2019-12-12 NOTE — Assessment & Plan Note (Signed)
Will do a urine culture and start him on Cipro.

## 2019-12-14 ENCOUNTER — Encounter: Payer: Self-pay | Admitting: Urology

## 2019-12-14 ENCOUNTER — Ambulatory Visit (INDEPENDENT_AMBULATORY_CARE_PROVIDER_SITE_OTHER): Payer: BC Managed Care – PPO | Admitting: Urology

## 2019-12-14 ENCOUNTER — Other Ambulatory Visit: Payer: Self-pay

## 2019-12-14 VITALS — BP 170/96 | HR 64 | Ht 72.0 in | Wt 263.0 lb

## 2019-12-14 DIAGNOSIS — M545 Low back pain, unspecified: Secondary | ICD-10-CM

## 2019-12-14 DIAGNOSIS — N400 Enlarged prostate without lower urinary tract symptoms: Secondary | ICD-10-CM | POA: Diagnosis not present

## 2019-12-14 DIAGNOSIS — N411 Chronic prostatitis: Secondary | ICD-10-CM

## 2019-12-14 DIAGNOSIS — R1030 Lower abdominal pain, unspecified: Secondary | ICD-10-CM

## 2019-12-14 DIAGNOSIS — N3010 Interstitial cystitis (chronic) without hematuria: Secondary | ICD-10-CM

## 2019-12-14 LAB — URINE CULTURE
MICRO NUMBER:: 10776190
Result:: NO GROWTH
SPECIMEN QUALITY:: ADEQUATE

## 2019-12-14 NOTE — Progress Notes (Signed)
12/14/2019 7:04 PM   Richard Burton 1955/12/31 [id number]  Referring provider: Cletis Athens, MD [address] [place],  [place] [address]  Chief Complaint  Patient presents with  . Other    HPI: 64 y.o. male presents for follow-up.   Initially seen August 2019 after being followed by Dr. Gaynelle Arabian in [place] for 20+ years  Previous diagnosis of BPH, chronic prostatitis and interstitial cystitis  Saw Dr. Lavera Guise 12/02/2019 complaining of low back and "kidney pain"  Dipstick urinalysis at that visit was negative  Returned 8/2 complaining of worsening back pain with radiation to the groin regions bilaterally.  Pain worse with bending or sitting for long periods  Repeat urinalysis was unremarkable.  Started on Cipro for prostatitis  Urine culture was negative  Denies fever, chills  No dysuria or gross hematuria   PMH: Past Medical History:  Diagnosis Date  . Anxiety and depression    a. severe - seeing psychiatry.  Marland Kitchen CAD (coronary artery disease)    a. s/p 4V CABG 2014  b. 05/2014 MV: No ischemia, EF 69%.  . Chronic prostatitis   . COPD (chronic obstructive pulmonary disease) ([facility])    ongoing tobacco use  . GERD (gastroesophageal reflux disease)   . History of DVT (deep vein thrombosis)   . Hyperlipidemia   . Hyperlipidemia   . Hypertension    a. renal arteries patent on remote cath.  . Obesity   . OSA (obstructive sleep apnea)    severe  . PAD (peripheral artery disease) ([facility])    a. 01/2014 ABIs R: 0.92, L 1.05.  Know total occlusion of right SFA with distal reconstitution-->Med Rx.  . Sleep apnea   . Tobacco abuse    a. 05/2014 down to 3-5 cigarettes day and vaping also.    Surgical History: Past Surgical History:  Procedure Laterality Date  . CHOLECYSTECTOMY    . CORONARY ARTERY BYPASS GRAFT  October 2014   4V  . CYSTOSCOPY    . KNEE ARTHROSCOPY     right meniscal tear    Home Medications:  Allergies as of 12/14/2019      Reactions   Sulfa  Antibiotics Diarrhea   Relafen [nabumetone] Other (See Comments)   Pt doesn't remember       Medication List       Accurate as of December 14, 2019  7:04 PM. If you have any questions, ask your nurse or doctor.        STOP taking these medications   traMADol 50 MG tablet Commonly known as: ULTRAM Stopped by: Abbie Sons, MD     TAKE these medications   aspirin EC 81 MG tablet Take by mouth.   atorvastatin 80 MG tablet Commonly known as: LIPITOR Take 1 tablet (80 mg total) by mouth daily.   ciprofloxacin 500 MG tablet Commonly known as: Cipro Take 1 tablet (500 mg total) by mouth 2 (two) times daily.   clopidogrel 75 MG tablet Commonly known as: PLAVIX Take 1 tablet (75 mg total) by mouth daily.   losartan 100 MG tablet Commonly known as: COZAAR Take 100 mg by mouth daily.   nitroGLYCERIN 0.4 MG SL tablet Commonly known as: NITROSTAT Place 1 tablet (0.4 mg total) under the tongue every 5 (five) minutes as needed for chest pain.   pantoprazole 40 MG tablet Commonly known as: PROTONIX Take 1 tablet (40 mg total) by mouth daily.   sertraline 100 MG tablet Commonly known as: ZOLOFT TAKE 1 1/2 TABLETS  BY MOUTH EVERY DAY       Allergies:  Allergies  Allergen Reactions  . Sulfa Antibiotics Diarrhea  . Relafen [Nabumetone] Other (See Comments)    Pt doesn't remember     Family History: Family History  Problem Relation Age of Onset  . Hypertension Mother   . Cataracts Mother   . Diabetes Father     Social History:  reports that he quit smoking about 5 years ago. His smoking use included cigarettes. He has a 40.00 pack-year smoking history. He has never used smokeless tobacco. He reports that he does not drink alcohol and does not use drugs.   Physical Exam: BP (!) 170/96   Pulse 64   Ht 6' (1.829 m)   Wt 263 lb (119.3 kg)   BMI 35.67 kg/m   Constitutional:  Alert and oriented, No acute distress. HEENT: [place] AT, moist mucus membranes.  Trachea  midline, no masses. Cardiovascular: No clubbing, cyanosis, or edema. Respiratory: Normal respiratory effort, no increased work of breathing. GU: Prostate 45 g, smooth without nodules, normal consistency, nontender.  No pelvic floor tenderness Lymph: No cervical or inguinal lymphadenopathy. Skin: No rashes, bruises or suspicious lesions. Neurologic: Grossly intact, no focal deficits, moving all 4 extremities. Psychiatric: Normal mood and affect.   Assessment & Plan:    1.  Bilateral back pain  Pain is above the waistline and more low back than flank  We discussed possibility of a musculoskeletal etiology and pain radiation due to nerve root involvement  I recommended a stone protocol CT of the abdomen pelvis to evaluate for any obvious GU abnormalities.  If none are identified would consider further evaluation for musculoskeletal/lumbar disc etiology.  2.  Chronic prostatitis  We discussed the vast majority of chronic prostatitis cases are inflammatory and noninfectious and would not recommend antibiotic treatment with a normal urinalysis  3.  Interstitial cystitis  Current symptoms be an IC flare.  He has been on Elmiron in the past but it became cost prohibitive.  He is on sertraline and would not recommend adding amitriptyline.  For worsening symptoms consider appointment with Dr. Amalia Hailey in [place].   Abbie Sons, [place] [address] [place], [place] [address] [phone]

## 2019-12-19 ENCOUNTER — Ambulatory Visit: Payer: BC Managed Care – PPO | Admitting: Internal Medicine

## 2019-12-28 ENCOUNTER — Other Ambulatory Visit: Payer: Self-pay

## 2019-12-28 ENCOUNTER — Ambulatory Visit
Admission: RE | Admit: 2019-12-28 | Discharge: 2019-12-28 | Disposition: A | Discharge status: Home / Self Care | Payer: BC Managed Care – PPO | Source: Ambulatory Visit | Attending: Urology | Admitting: Urology

## 2019-12-28 DIAGNOSIS — D3500 Benign neoplasm of unspecified adrenal gland: Secondary | ICD-10-CM | POA: Diagnosis not present

## 2019-12-28 DIAGNOSIS — R1030 Lower abdominal pain, unspecified: Secondary | ICD-10-CM | POA: Diagnosis not present

## 2019-12-28 DIAGNOSIS — I700 Atherosclerosis of aorta: Secondary | ICD-10-CM | POA: Diagnosis not present

## 2019-12-28 DIAGNOSIS — K760 Fatty (change of) liver, not elsewhere classified: Secondary | ICD-10-CM | POA: Diagnosis not present

## 2019-12-28 DIAGNOSIS — E278 Other specified disorders of adrenal gland: Secondary | ICD-10-CM | POA: Diagnosis not present

## 2019-12-30 ENCOUNTER — Other Ambulatory Visit: Payer: Self-pay

## 2019-12-30 ENCOUNTER — Encounter: Payer: Self-pay | Admitting: Urology

## 2019-12-30 ENCOUNTER — Ambulatory Visit (INDEPENDENT_AMBULATORY_CARE_PROVIDER_SITE_OTHER): Payer: BC Managed Care – PPO | Admitting: Urology

## 2019-12-30 VITALS — BP 138/69 | HR 68 | Ht 71.0 in | Wt 260.0 lb

## 2019-12-30 DIAGNOSIS — M5430 Sciatica, unspecified side: Secondary | ICD-10-CM

## 2019-12-30 DIAGNOSIS — D3502 Benign neoplasm of left adrenal gland: Secondary | ICD-10-CM

## 2019-12-30 DIAGNOSIS — R109 Unspecified abdominal pain: Secondary | ICD-10-CM

## 2019-12-30 DIAGNOSIS — M549 Dorsalgia, unspecified: Secondary | ICD-10-CM | POA: Diagnosis not present

## 2019-12-31 ENCOUNTER — Encounter: Payer: Self-pay | Admitting: Urology

## 2019-12-31 NOTE — Progress Notes (Signed)
12/30/2019 11:58 AM   Richard Burton [birth year] [id number]  Referring provider: Cletis Athens, MD [address] [place],  [place] [address]  Chief Complaint  Patient presents with   Flank Pain    HPI: 64 y.o. male presents for follow-up.   Seen 12/14/2019 for back pain radiating to groin  Etiology was felt to be more musculoskeletal  Since that time he has developed pain radiating down his right leg  Pain exacerbated by bending over/lifting  Noncontrast CT abdomen pelvis 12/28/2019 showed no urinary tract calculi or hydronephrosis  There was a 3.8 cm mass in the left adrenal gland which was noted on a prior CT in 2007 which measured 1.9 cm; felt consistent with adrenal adenoma   PMH: Past Medical History:  Diagnosis Date   Anxiety and depression    a. severe - seeing psychiatry.   CAD (coronary artery disease)    a. s/p 4V CABG 2014  b. 05/2014 MV: No ischemia, EF 69%.   Chronic prostatitis    COPD (chronic obstructive pulmonary disease) ([facility])    ongoing tobacco use   GERD (gastroesophageal reflux disease)    History of DVT (deep vein thrombosis)    Hyperlipidemia    Hyperlipidemia    Hypertension    a. renal arteries patent on remote cath.   Obesity    OSA (obstructive sleep apnea)    severe   PAD (peripheral artery disease) ([facility])    a. 01/2014 ABIs R: 0.92, L 1.05.  Know total occlusion of right SFA with distal reconstitution-->Med Rx.   Sleep apnea    Tobacco abuse    a. 05/2014 down to 3-5 cigarettes day and vaping also.    Surgical History: Past Surgical History:  Procedure Laterality Date   CHOLECYSTECTOMY     CORONARY ARTERY BYPASS GRAFT  October 2014   4V   CYSTOSCOPY     KNEE ARTHROSCOPY     right meniscal tear    Home Medications:  Allergies as of 12/30/2019      Reactions   Sulfa Antibiotics Diarrhea   Relafen [nabumetone] Other (See Comments)   Pt doesn't remember       Medication List       Accurate as of December 30, 2019 11:59 PM. If you have any questions, ask your nurse or doctor.        aspirin EC 81 MG tablet Take by mouth.   atorvastatin 80 MG tablet Commonly known as: LIPITOR Take 1 tablet (80 mg total) by mouth daily.   ciprofloxacin 500 MG tablet Commonly known as: Cipro Take 1 tablet (500 mg total) by mouth 2 (two) times daily.   clopidogrel 75 MG tablet Commonly known as: PLAVIX Take 1 tablet (75 mg total) by mouth daily.   losartan 100 MG tablet Commonly known as: COZAAR Take 100 mg by mouth daily.   nitroGLYCERIN 0.4 MG SL tablet Commonly known as: NITROSTAT Place 1 tablet (0.4 mg total) under the tongue every 5 (five) minutes as needed for chest pain.   pantoprazole 40 MG tablet Commonly known as: PROTONIX Take 1 tablet (40 mg total) by mouth daily.   sertraline 100 MG tablet Commonly known as: ZOLOFT TAKE 1 1/2 TABLETS BY MOUTH EVERY DAY       Allergies:  Allergies  Allergen Reactions   Sulfa Antibiotics Diarrhea   Relafen [Nabumetone] Other (See Comments)    Pt doesn't remember     Family History: Family History  Problem Relation Age of  Onset   Hypertension Mother    Cataracts Mother    Diabetes Father     Social History:  reports that he quit smoking about 5 years ago. His smoking use included cigarettes. He has a 40.00 pack-year smoking history. He has never used smokeless tobacco. He reports that he does not drink alcohol and does not use drugs.   Physical Exam: BP 138/69    Pulse 68    Ht 5\' 11"  (1.803 m)    Wt 260 lb (117.9 kg)    BMI 36.26 kg/m   Constitutional:  Alert and oriented, No acute distress. HEENT: [place] AT, moist mucus membranes.  Trachea midline, no masses. Cardiovascular: No clubbing, cyanosis, or edema. Respiratory: Normal respiratory effort, no increased work of breathing.   Pertinent Imaging: CT personally reviewed  CT RENAL STONE STUDY  Narrative CLINICAL DATA:  Right flank pain and back pain for 1 month.   Nausea.  EXAM: CT ABDOMEN AND PELVIS WITHOUT CONTRAST  TECHNIQUE: Multidetector CT imaging of the abdomen and pelvis was performed following the standard protocol without IV contrast.  COMPARISON:  12/10/2005.  FINDINGS: Lower chest: Smudgy nodule adjacent to the right hemidiaphragm measures 5 mm (3/10) and is indicative of a subpleural lymph node. Lung bases are otherwise clear. Heart size normal. Coronary artery calcification. No pericardial or pleural effusion. Distal esophagus is grossly unremarkable.  Hepatobiliary: Liver may be slightly heterogeneous in attenuation. Cholecystectomy. No biliary ductal dilatation.  Pancreas: Negative.  Spleen: Negative.  Adrenals/Urinary Tract: Adrenal glands are unremarkable. Fluid density mass in the left adrenal gland measures 3.8 cm, increased from 1.8 cm on 12/10/2005. Kidneys are unremarkable. No urinary stones. Ureters are decompressed. Bladder is grossly unremarkable.  Stomach/Bowel: Stomach, small bowel, appendix and colon are unremarkable.  Vascular/Lymphatic: Atherosclerotic calcification of the aorta without aneurysm. No pathologically enlarged lymph nodes.  Reproductive: Prostate is normal in size.  Other: No free fluid.  Mesenteries and peritoneum are unremarkable.  Musculoskeletal: Degenerative changes in the spine. No worrisome lytic or sclerotic lesions.  IMPRESSION: 1. No urinary stones or obstruction. No findings to explain the patient's symptoms. 2. Liver appears slightly steatotic. 3. Left adrenal adenoma, increased in size from 01/07. 4. Aortic atherosclerosis (ICD10-I70.0). Coronary artery calcification.   Electronically Signed By: Lorin Picket M.D. On: 12/28/2019 16:12   Assessment & Plan:    1.  Back pain  Unlikely urologic related and appears more a musculoskeletal etiology  His pain has worsened in the last week and will refer to orthopedics  2.  Adrenal adenoma  Noted to be 1.9 cm  in 2007 and most recent CT at 3.8 cm  He states this has never been evaluated and will refer to endocrinology to see if this mass is functioning   Abbie Sons, [facility] [address] [place], [place] [address] [phone]

## 2020-01-02 LAB — URINALYSIS, COMPLETE
Bilirubin, UA: NEGATIVE
Glucose, UA: NEGATIVE
Ketones, UA: NEGATIVE
Leukocytes,UA: NEGATIVE
Nitrite, UA: NEGATIVE
Specific Gravity, UA: 1.025 (ref 1.005–1.030)
Urobilinogen, Ur: 0.2 mg/dL (ref 0.2–1.0)
pH, UA: 6 (ref 5.0–7.5)

## 2020-01-02 LAB — MICROSCOPIC EXAMINATION

## 2020-01-11 ENCOUNTER — Encounter: Payer: Self-pay | Admitting: Internal Medicine

## 2020-01-11 ENCOUNTER — Other Ambulatory Visit: Payer: Self-pay

## 2020-01-11 ENCOUNTER — Ambulatory Visit (INDEPENDENT_AMBULATORY_CARE_PROVIDER_SITE_OTHER): Payer: BC Managed Care – PPO | Admitting: Internal Medicine

## 2020-01-11 VITALS — BP 166/91 | HR 73 | Ht 73.0 in | Wt 264.0 lb

## 2020-01-11 DIAGNOSIS — R42 Dizziness and giddiness: Secondary | ICD-10-CM | POA: Diagnosis not present

## 2020-01-11 DIAGNOSIS — G629 Polyneuropathy, unspecified: Secondary | ICD-10-CM

## 2020-01-11 DIAGNOSIS — Z6834 Body mass index (BMI) 34.0-34.9, adult: Secondary | ICD-10-CM

## 2020-01-11 DIAGNOSIS — M5441 Lumbago with sciatica, right side: Secondary | ICD-10-CM

## 2020-01-11 DIAGNOSIS — E662 Morbid (severe) obesity with alveolar hypoventilation: Secondary | ICD-10-CM

## 2020-01-11 DIAGNOSIS — Z87891 Personal history of nicotine dependence: Secondary | ICD-10-CM

## 2020-01-11 DIAGNOSIS — I10 Essential (primary) hypertension: Secondary | ICD-10-CM | POA: Diagnosis not present

## 2020-01-11 DIAGNOSIS — M5442 Lumbago with sciatica, left side: Secondary | ICD-10-CM

## 2020-01-11 MED ORDER — ALPRAZOLAM 0.25 MG PO TABS: 0.2500 mg | ORAL_TABLET | Freq: Two times a day (BID) | ORAL | 0 refills | Status: AC | PRN

## 2020-01-11 MED ORDER — DICLOFENAC SODIUM 1 % EX GEL: 4.0000 g | Freq: Four times a day (QID) | CUTANEOUS | 3 refills | Status: DC

## 2020-01-11 MED ORDER — CYCLOBENZAPRINE HCL 10 MG PO TABS: 10.0000 mg | ORAL_TABLET | Freq: Three times a day (TID) | ORAL | 0 refills | Status: DC | PRN

## 2020-01-11 NOTE — Assessment & Plan Note (Signed)
Pt strained his back during work. He complains of pain in his back radiating to the right more than the left. SLR is positive on the right side. Pt was given muscle relaxant and voltaren gel. Advised to return back as needed.

## 2020-01-11 NOTE — Assessment & Plan Note (Signed)
Is stable at the present time.

## 2020-01-11 NOTE — Assessment & Plan Note (Signed)
-   I instructed the patient to stop smoking and provided them with smoking cessation materials.  - I informed the patient that smoking puts them at increased risk for cancer, COPD, hypertension, and more.  - Informed the patient to seek help if they begin to have trouble breathing, develop chest pain, start to cough up blood, feel faint, or pass out.  

## 2020-01-11 NOTE — Assessment & Plan Note (Signed)
-   Today, the patient's blood pressure is well managed on losartin. - The patient will continue the current treatment regimen.  - I encouraged the patient to eat a low-sodium diet to help control blood pressure. - I encouraged the patient to live an active lifestyle and complete activities that increases heart rate to 85% target heart rate at least 5 times per week for one hour.

## 2020-01-11 NOTE — Progress Notes (Signed)
Established Patient Office Visit  SUBJECTIVE:  Subjective  Patient ID: Richard Burton, male    DOB: 1956/04/17  Age: 64 y.o. MRN: [id number]  CC:  Chief Complaint  Patient presents with  . Back Pain    patient has an apt with ortho in late september, but is asking if he can getg a muscle relaxer to help until then   . Anxiety    patient requesting refill of xanax     HPI Richard Burton is a 64 y.o. male presenting today for an evaluation of his low back pain and a refill of his anxiety medication.  He continues to be anxious about a lot of things. He says there is a lot to be anxious about and we would need all day to talk about it all.   He was evaluated by Dr. Bernardo Burton for his low back pain on 12/30/2019. CT of the abdomen and pelvis revealed no urinary stones or obstruction. No findings to explain the patient's symptoms. Liver appears slightly steatotic. Left adrenal adenoma, increased in size from 01/07. Aortic atherosclerosis (ICD10-I70.0). Coronary artery calcification. Dr. Bernardo Burton feels like the back pain is likely musculoskeletal and referred him to orthopedics, but he does not have an appointment until mid-September. Richard Burton would like some kind of relief for his pain to get him until he can see orthopedics.    Past Medical History:  Diagnosis Date  . Anxiety and depression    a. severe - seeing psychiatry.  Mar[place] Kitchen CAD (coronary artery disease)    a. s/p 4V CABG 2014  b. 05/2014 MV: No ischemia, EF 69%.  . Chronic prostatitis   . COPD (chronic obstructive pulmonary disease) ([place])    ongoing tobacco use  . GERD (gastroesophageal reflux disease)   . History of DVT (deep vein thrombosis)   . Hyperlipidemia   . Hyperlipidemia   . Hypertension    a. renal arteries patent on remote cath.  . Obesity   . OSA (obstructive sleep apnea)    severe  . PAD (peripheral artery disease) ([place])    a. 01/2014 ABIs R: 0.92, L 1.05.  Know total occlusion of right SFA with distal  reconstitution-->Med Rx.  . Sleep apnea   . Tobacco abuse    a. 05/2014 down to 3-5 cigarettes day and vaping also.    Past Surgical History:  Procedure Laterality Date  . CHOLECYSTECTOMY    . CORONARY ARTERY BYPASS GRAFT  October 2014   4V  . CYSTOSCOPY    . KNEE ARTHROSCOPY     right meniscal tear    Family History  Problem Relation Age of Onset  . Hypertension Mother   . Cataracts Mother   . Diabetes Father     Social History   Socioeconomic History  . Marital status: Married    Spouse name: Not on file  . Number of children: Not on file  . Years of education: Not on file  . Highest education level: Not on file  Occupational History  . Not on file  Tobacco Use  . Smoking status: Former Smoker    Packs/day: 1.00    Years: 40.00    Pack years: 40.00    Types: Cigarettes    Quit date: 08/11/2014    Years since quitting: 5.4  . Smokeless tobacco: Never Used  Vaping Use  . Vaping Use: Never used  Substance and Sexual Activity  . Alcohol use: No    Alcohol/week: 0.0 standard drinks  . Drug  use: No  . Sexual activity: Yes    Birth control/protection: None  Other Topics Concern  . Not on file  Social History Narrative  . Not on file   Social Determinants of Health   Financial Resource Strain:   . Difficulty of Paying Living Expenses: Not on file  Food Insecurity:   . Worried About Charity fundraiser in the Last Year: Not on file  . Ran Out of Food in the Last Year: Not on file  Transportation Needs:   . Lack of Transportation (Medical): Not on file  . Lack of Transportation (Non-Medical): Not on file  Physical Activity:   . Days of Exercise per Week: Not on file  . Minutes of Exercise per Session: Not on file  Stress:   . Feeling of Stress : Not on file  Social Connections:   . Frequency of Communication with Friends and Family: Not on file  . Frequency of Social Gatherings with Friends and Family: Not on file  . Attends Religious Services: Not on  file  . Active Member of Clubs or Organizations: Not on file  . Attends Archivist Meetings: Not on file  . Marital Status: Not on file  Intimate Partner Violence:   . Fear of Current or Ex-Partner: Not on file  . Emotionally Abused: Not on file  . Physically Abused: Not on file  . Sexually Abused: Not on file     Current Outpatient Medications:  .  aspirin EC 81 MG tablet, Take by mouth., Disp: , Rfl:  .  atorvastatin (LIPITOR) 80 MG tablet, Take 1 tablet (80 mg total) by mouth daily., Disp: 90 tablet, Rfl: 3 .  ciprofloxacin (CIPRO) 500 MG tablet, Take 1 tablet (500 mg total) by mouth 2 (two) times daily., Disp: 20 tablet, Rfl: 0 .  clopidogrel (PLAVIX) 75 MG tablet, Take 1 tablet (75 mg total) by mouth daily., Disp: 90 tablet, Rfl: 3 .  losartan (COZAAR) 100 MG tablet, Take 100 mg by mouth daily., Disp: , Rfl:  .  nitroGLYCERIN (NITROSTAT) 0.4 MG SL tablet, Place 1 tablet (0.4 mg total) under the tongue every 5 (five) minutes as needed for chest pain., Disp: 50 tablet, Rfl: 3 .  pantoprazole (PROTONIX) 40 MG tablet, Take 1 tablet (40 mg total) by mouth daily., Disp: 30 tablet, Rfl: 6 .  sertraline (ZOLOFT) 100 MG tablet, TAKE 1 1/2 TABLETS BY MOUTH EVERY DAY, Disp: 45 tablet, Rfl: 0 .  cyclobenzaprine (FLEXERIL) 10 MG tablet, Take 1 tablet (10 mg total) by mouth 3 (three) times daily as needed for muscle spasms., Disp: 30 tablet, Rfl: 0 .  diclofenac Sodium (VOLTAREN) 1 % GEL, Apply 4 g topically 4 (four) times daily., Disp: 50 g, Rfl: 3   Allergies  Allergen Reactions  . Sulfa Antibiotics Diarrhea  . Relafen [Nabumetone] Other (See Comments)    Pt doesn't remember     ROS Review of Systems  Constitutional: Negative.   HENT: Negative.   Eyes: Negative.   Respiratory: Negative.   Cardiovascular: Negative.   Gastrointestinal: Negative.   Endocrine: Negative.   Genitourinary: Negative.   Musculoskeletal: Positive for back pain.  Skin: Negative.     Allergic/Immunologic: Negative.   Neurological: Negative.   Hematological: Negative.   Psychiatric/Behavioral: Negative.   All other systems reviewed and are negative.    OBJECTIVE:    Physical Exam Vitals reviewed.  Constitutional:      Appearance: Normal appearance.  HENT:     Mouth/Throat:  Mouth: Mucous membranes are moist.  Eyes:     Pupils: Pupils are equal, round, and reactive to light.  Neck:     Vascular: No carotid bruit.  Cardiovascular:     Rate and Rhythm: Normal rate and regular rhythm.     Pulses: Normal pulses.     Heart sounds: Normal heart sounds.  Pulmonary:     Effort: Pulmonary effort is normal.     Breath sounds: Normal breath sounds.  Abdominal:     General: Bowel sounds are normal.     Palpations: Abdomen is soft. There is no hepatomegaly, splenomegaly or mass.     Tenderness: There is no abdominal tenderness.     Hernia: No hernia is present.  Musculoskeletal:     Cervical back: Neck supple.     Right lower leg: No edema.     Left lower leg: No edema.  Skin:    Findings: No rash.  Neurological:     Mental Status: He is alert and oriented to person, place, and time.     Motor: No weakness.  Psychiatric:        Mood and Affect: Mood normal.        Behavior: Behavior normal.     BP (!) 166/91   Pulse 73   Ht 6\' 1"  (1.854 m)   Wt 264 lb (119.7 kg)   BMI 34.83 kg/m  Wt Readings from Last 3 Encounters:  01/11/20 264 lb (119.7 kg)  12/30/19 260 lb (117.9 kg)  12/14/19 263 lb (119.3 kg)    Health Maintenance Due  Topic Date Due  . Hepatitis C Screening  Never done  . COVID-19 Vaccine (1) Never done  . HIV Screening  Never done  . COLONOSCOPY  Never done  . TETANUS/TDAP  12/19/2014  . INFLUENZA VACCINE  12/11/2019    There are no preventive care reminders to display for this patient.  CBC Latest Ref Rng & Units 11/30/2019 05/19/2014 05/19/2014  WBC 3.8 - 10.8 Thousand/uL 7.0 7.3 8.2  Hemoglobin 13.2 - 17.1 g/dL 16.2 15.9 16.7   Hematocrit 38 - 50 % 47.6 45.2 48.6  Platelets 140 - 400 Thousand/uL 217 188 206   CMP Latest Ref Rng & Units 11/30/2019 10/26/2015 05/19/2014  Glucose 65 - 99 mg/dL 135(H) - -  BUN 7 - 25 mg/dL 16 - -  Creatinine 0.70 - 1.25 mg/dL 1.06 - 1.07  Sodium 135 - 146 mmol/L 142 - -  Potassium 3.5 - 5.3 mmol/L 5.4(H) - -  Chloride 98 - 110 mmol/L 104 - -  CO2 20 - 32 mmol/L 21 - -  Calcium 8.6 - 10.3 mg/dL 9.7 - -  Total Protein 6.1 - 8.1 g/dL 7.3 7.1 -  Total Bilirubin 0.2 - 1.2 mg/dL 0.4 0.6 -  Alkaline Phos 40 - 115 U/L - 69 -  AST 10 - 35 U/L 33 19 -  ALT 9 - 46 U/L 31 16 -    Lab Results  Component Value Date   TSH 1.90 11/30/2019   Lab Results  Component Value Date   ALBUMIN 4.3 10/26/2015   ANIONGAP 14 05/19/2014   Lab Results  Component Value Date   CHOL 205 (H) 11/30/2019   CHOL 153 10/26/2015   CHOL 100 03/16/2014   HDL 44 11/30/2019   HDL 51 10/26/2015   HDL 31 (L) 03/16/2014   LDLCALC 134 (H) 11/30/2019   LDLCALC 89 10/26/2015   LDLCALC 46 03/16/2014   CHOLHDL 4.7 11/30/2019  CHOLHDL 3.0 10/26/2015   CHOLHDL 6 12/12/2008   Lab Results  Component Value Date   TRIG 148 11/30/2019   Lab Results  Component Value Date   HGBA1C 5.2 02/04/2006      ASSESSMENT & PLAN:   Problem List Items Addressed This Visit      Cardiovascular and Mediastinum   Essential hypertension    - Today, the patient's blood pressure is well managed on losartin. - The patient will continue the current treatment regimen.  - I encouraged the patient to eat a low-sodium diet to help control blood pressure. - I encouraged the patient to live an active lifestyle and complete activities that increases heart rate to 85% target heart rate at least 5 times per week for one hour.            Nervous and Auditory   Neuropathy    Chronic problem. Pt was advised to lose weight.         Other   HX, PERSONAL, TOBACCO USE    - I instructed the patient to stop smoking and provided them  with smoking cessation materials.  - I informed the patient that smoking puts them at increased risk for cancer, COPD, hypertension, and more.  - Informed the patient to seek help if they begin to have trouble breathing, develop chest pain, start to cough up blood, feel faint, or pass out.       Obesity    - I encouraged the patient to lose weight.  - I educated them on making healthy dietary choices including eating more fruits and vegetables and less fried foods. - I encouraged the patient to exercise more, and educated on the benefits of exercise including weight loss, diabetes prevention, and hypertension management.        Dizziness    Is stable at the present time.       Acute bilateral low back pain with bilateral sciatica - Primary    Pt strained his back during work. He complains of pain in his back radiating to the right more than the left. SLR is positive on the right side. Pt was given muscle relaxant and voltaren gel. Advised to return back as needed.       Relevant Medications   cyclobenzaprine (FLEXERIL) 10 MG tablet      Meds ordered this encounter  Medications  . diclofenac Sodium (VOLTAREN) 1 % GEL    Sig: Apply 4 g topically 4 (four) times daily.    Dispense:  50 g    Refill:  3  . cyclobenzaprine (FLEXERIL) 10 MG tablet    Sig: Take 1 tablet (10 mg total) by mouth 3 (three) times daily as needed for muscle spasms.    Dispense:  30 tablet    Refill:  0    Follow-up: No follow-ups on file.    Dr. Jane Canary [facility] [address], [place], [place] [address]   By signing my name below, I, [employer], attest that this documentation has been prepared under the direction and in the presence of Cletis Athens, MD. Electronically Signed: Cletis Athens, MD 01/11/20, 10:07 AM   I personally performed the services described in this documentation, which was SCRIBED in my presence. The recorded information has been reviewed and considered  accurate. It has been edited as necessary during review. Cletis Athens, MD

## 2020-01-11 NOTE — Assessment & Plan Note (Signed)
Chronic problem. Pt was advised to lose weight.

## 2020-01-11 NOTE — Assessment & Plan Note (Signed)
-   I encouraged the patient to lose weight.  - I educated them on making healthy dietary choices including eating more fruits and vegetables and less fried foods. - I encouraged the patient to exercise more, and educated on the benefits of exercise including weight loss, diabetes prevention, and hypertension management.   

## 2020-01-20 DIAGNOSIS — M545 Low back pain: Secondary | ICD-10-CM | POA: Diagnosis not present

## 2020-01-30 DIAGNOSIS — M545 Low back pain: Secondary | ICD-10-CM | POA: Diagnosis not present

## 2020-02-02 ENCOUNTER — Ambulatory Visit: Payer: BC Managed Care – PPO | Admitting: Internal Medicine

## 2020-02-13 ENCOUNTER — Other Ambulatory Visit: Payer: Self-pay

## 2020-02-13 ENCOUNTER — Encounter: Payer: Self-pay | Admitting: Internal Medicine

## 2020-02-13 ENCOUNTER — Ambulatory Visit (INDEPENDENT_AMBULATORY_CARE_PROVIDER_SITE_OTHER): Payer: BC Managed Care – PPO | Admitting: Internal Medicine

## 2020-02-13 VITALS — BP 177/99 | HR 76 | Ht 70.0 in | Wt 266.9 lb

## 2020-02-13 DIAGNOSIS — K219 Gastro-esophageal reflux disease without esophagitis: Secondary | ICD-10-CM

## 2020-02-13 DIAGNOSIS — M5441 Lumbago with sciatica, right side: Secondary | ICD-10-CM | POA: Diagnosis not present

## 2020-02-13 DIAGNOSIS — J449 Chronic obstructive pulmonary disease, unspecified: Secondary | ICD-10-CM | POA: Diagnosis not present

## 2020-02-13 DIAGNOSIS — G629 Polyneuropathy, unspecified: Secondary | ICD-10-CM | POA: Diagnosis not present

## 2020-02-13 DIAGNOSIS — G8929 Other chronic pain: Secondary | ICD-10-CM

## 2020-02-13 DIAGNOSIS — K2990 Gastroduodenitis, unspecified, without bleeding: Secondary | ICD-10-CM

## 2020-02-13 DIAGNOSIS — Z6834 Body mass index (BMI) 34.0-34.9, adult: Secondary | ICD-10-CM

## 2020-02-13 DIAGNOSIS — E662 Morbid (severe) obesity with alveolar hypoventilation: Secondary | ICD-10-CM | POA: Diagnosis not present

## 2020-02-13 MED ORDER — PANTOPRAZOLE SODIUM 40 MG PO TBEC: 40.0000 mg | DELAYED_RELEASE_TABLET | Freq: Every day | ORAL | 3 refills | Status: DC

## 2020-02-13 MED ORDER — ALPRAZOLAM 0.25 MG PO TABS: 0.2500 mg | ORAL_TABLET | Freq: Two times a day (BID) | ORAL | 0 refills | Status: AC

## 2020-02-13 NOTE — Progress Notes (Signed)
Established Patient Office Visit  SUBJECTIVE:  Subjective  Patient ID: Richard Burton, male    DOB: 28-Apr-1956  Age: 64 y.o. MRN: [id number]  CC:  Chief Complaint  Patient presents with   Back Pain    patient is still having a lot of back pain, he has seen ortho and urology    Anxiety    HPI Richard Burton is a 64 y.o. male presenting today for a follow up visit for his back pain.  He went to Dr. Bernardo Heater in Urology. He underwent a CT renal stone study which did not reveal any sources for his back pain. He went to an orthopedist, who recommended physical therapy; this has been unsuccessful. His insurance company is refusing MRI and he is having trouble with physical therapy because of the pain without pain relievers. His pain is aggrivated by constant motion and pressure, worse during the week while at work. His pain is not being managed by ibuprofen and tylenol. The pain radiates from his right lower back to around his hip and down his thigh. He states that when "the pain hits" his pain is a 10/10.   He needs a refill on his Xanax 0.25 BID. He also needs a refill on his    He has an appointment with endocrinology on 02/27/2020.    Past Medical History:  Diagnosis Date   Anxiety and depression    a. severe - seeing psychiatry.   CAD (coronary artery disease)    a. s/p 4V CABG 2014  b. 05/2014 MV: No ischemia, EF 69%.   Chronic prostatitis    COPD (chronic obstructive pulmonary disease) ([facility])    ongoing tobacco use   GERD (gastroesophageal reflux disease)    History of DVT (deep vein thrombosis)    Hyperlipidemia    Hyperlipidemia    Hypertension    a. renal arteries patent on remote cath.   Obesity    OSA (obstructive sleep apnea)    severe   PAD (peripheral artery disease) ([place])    a. 01/2014 ABIs R: 0.92, L 1.05.  Know total occlusion of right SFA with distal reconstitution-->Med Rx.   Sleep apnea    Tobacco abuse    a. 05/2014 down to 3-5 cigarettes  day and vaping also.    Past Surgical History:  Procedure Laterality Date   CHOLECYSTECTOMY     CORONARY ARTERY BYPASS GRAFT  October 2014   4V   CYSTOSCOPY     KNEE ARTHROSCOPY     right meniscal tear    Family History  Problem Relation Age of Onset   Hypertension Mother    Cataracts Mother    Diabetes Father     Social History   Socioeconomic History   Marital status: Married    Spouse name: Not on file   Number of children: Not on file   Years of education: Not on file   Highest education level: Not on file  Occupational History   Not on file  Tobacco Use   Smoking status: Former Smoker    Packs/day: 1.00    Years: 40.00    Pack years: 40.00    Types: Cigarettes    Quit date: 08/11/2014    Years since quitting: 5.5   Smokeless tobacco: Never Used  Vaping Use   Vaping Use: Never used  Substance and Sexual Activity   Alcohol use: No    Alcohol/week: 0.0 standard drinks   Drug use: No   Sexual activity: Yes  Birth control/protection: None  Other Topics Concern   Not on file  Social History Narrative   Not on file   Social Determinants of Health   Financial Resource Strain:    Difficulty of Paying Living Expenses: Not on file  Food Insecurity:    Worried About Charity fundraiser in the Last Year: Not on file   [employer] of Food in the Last Year: Not on file  Transportation Needs:    Lack of Transportation (Medical): Not on file   Lack of Transportation (Non-Medical): Not on file  Physical Activity:    Days of Exercise per Week: Not on file   Minutes of Exercise per Session: Not on file  Stress:    Feeling of Stress : Not on file  Social Connections:    Frequency of Communication with Friends and Family: Not on file   Frequency of Social Gatherings with Friends and Family: Not on file   Attends Religious Services: Not on file   Active Member of Clubs or Organizations: Not on file   Attends Archivist  Meetings: Not on file   Marital Status: Not on file  Intimate Partner Violence:    Fear of Current or Ex-Partner: Not on file   Emotionally Abused: Not on file   Physically Abused: Not on file   Sexually Abused: Not on file     Current Outpatient Medications:    aspirin EC 81 MG tablet, Take by mouth., Disp: , Rfl:    atorvastatin (LIPITOR) 80 MG tablet, Take 1 tablet (80 mg total) by mouth daily., Disp: 90 tablet, Rfl: 3   clopidogrel (PLAVIX) 75 MG tablet, Take 1 tablet (75 mg total) by mouth daily., Disp: 90 tablet, Rfl: 3   losartan (COZAAR) 100 MG tablet, Take 100 mg by mouth daily., Disp: , Rfl:    nitroGLYCERIN (NITROSTAT) 0.4 MG SL tablet, Place 1 tablet (0.4 mg total) under the tongue every 5 (five) minutes as needed for chest pain., Disp: 50 tablet, Rfl: 3   pantoprazole (PROTONIX) 40 MG tablet, Take 1 tablet (40 mg total) by mouth daily., Disp: 30 tablet, Rfl: 6   sertraline (ZOLOFT) 100 MG tablet, TAKE 1 1/2 TABLETS BY MOUTH EVERY DAY, Disp: 45 tablet, Rfl: 0   ALPRAZolam (XANAX) 0.25 MG tablet, Take 1 tablet (0.25 mg total) by mouth 2 (two) times daily., Disp: 60 tablet, Rfl: 0   ciprofloxacin (CIPRO) 500 MG tablet, Take 1 tablet (500 mg total) by mouth 2 (two) times daily. (Patient not taking: Reported on 02/13/2020), Disp: 20 tablet, Rfl: 0   cyclobenzaprine (FLEXERIL) 10 MG tablet, Take 1 tablet (10 mg total) by mouth 3 (three) times daily as needed for muscle spasms. (Patient not taking: Reported on 02/13/2020), Disp: 30 tablet, Rfl: 0   diclofenac Sodium (VOLTAREN) 1 % GEL, Apply 4 g topically 4 (four) times daily. (Patient not taking: Reported on 02/13/2020), Disp: 50 g, Rfl: 3   pantoprazole (PROTONIX) 40 MG tablet, Take 1 tablet (40 mg total) by mouth daily., Disp: 30 tablet, Rfl: 3   Allergies  Allergen Reactions   Sulfa Antibiotics Diarrhea   Relafen [Nabumetone] Other (See Comments)    Pt doesn't remember     ROS Review of Systems    Constitutional: Negative.   HENT: Negative.   Eyes: Negative.   Respiratory: Positive for shortness of breath (COPD).   Cardiovascular: Negative.   Gastrointestinal: Negative.   Endocrine: Negative.   Genitourinary: Negative.   Musculoskeletal: Positive for  arthralgias and back pain.  Skin: Negative.   Allergic/Immunologic: Negative.   Neurological: Negative.   Hematological: Negative.   Psychiatric/Behavioral: The patient is nervous/anxious.   All other systems reviewed and are negative.    OBJECTIVE:    Physical Exam Vitals reviewed.  Constitutional:      Appearance: Normal appearance.  HENT:     Mouth/Throat:     Mouth: Mucous membranes are moist.  Eyes:     Pupils: Pupils are equal, round, and reactive to light.  Neck:     Vascular: No carotid bruit.  Cardiovascular:     Rate and Rhythm: Normal rate and regular rhythm.     Pulses: Normal pulses.     Heart sounds: Normal heart sounds.  Pulmonary:     Effort: Pulmonary effort is normal.     Breath sounds: Normal breath sounds.  Abdominal:     General: Bowel sounds are normal.     Palpations: Abdomen is soft. There is no hepatomegaly, splenomegaly or mass.     Tenderness: There is no abdominal tenderness.     Hernia: No hernia is present.  Musculoskeletal:     Cervical back: Neck supple.     Thoracic back: Bony tenderness present.     Lumbar back: Tenderness present.       Back:     Right lower leg: No edema.     Left lower leg: No edema.  Skin:    Findings: No rash.  Neurological:     Mental Status: He is alert and oriented to person, place, and time.     Motor: No weakness.  Psychiatric:        Mood and Affect: Mood normal.        Behavior: Behavior normal.     BP (!) 177/99    Pulse 76    Ht 5\' 10"  (1.778 m)    Wt 266 lb 14.4 oz (121.1 kg)    BMI 38.30 kg/m  Wt Readings from Last 3 Encounters:  02/13/20 266 lb 14.4 oz (121.1 kg)  01/11/20 264 lb (119.7 kg)  12/30/19 260 lb (117.9 kg)     Health Maintenance Due  Topic Date Due   Hepatitis C Screening  Never done   COVID-19 Vaccine (1) Never done   HIV Screening  Never done   COLONOSCOPY  Never done   TETANUS/TDAP  12/19/2014   INFLUENZA VACCINE  12/11/2019    There are no preventive care reminders to display for this patient.  CBC Latest Ref Rng & Units 11/30/2019 05/19/2014 05/19/2014  WBC 3.8 - 10.8 Thousand/uL 7.0 7.3 8.2  Hemoglobin 13.2 - 17.1 g/dL 16.2 15.9 16.7  Hematocrit 38 - 50 % 47.6 45.2 48.6  Platelets 140 - 400 Thousand/uL 217 188 206   CMP Latest Ref Rng & Units 11/30/2019 10/26/2015 05/19/2014  Glucose 65 - 99 mg/dL 135(H) - -  BUN 7 - 25 mg/dL 16 - -  Creatinine 0.70 - 1.25 mg/dL 1.06 - 1.07  Sodium 135 - 146 mmol/L 142 - -  Potassium 3.5 - 5.3 mmol/L 5.4(H) - -  Chloride 98 - 110 mmol/L 104 - -  CO2 20 - 32 mmol/L 21 - -  Calcium 8.6 - 10.3 mg/dL 9.7 - -  Total Protein 6.1 - 8.1 g/dL 7.3 7.1 -  Total Bilirubin 0.2 - 1.2 mg/dL 0.4 0.6 -  Alkaline Phos 40 - 115 U/L - 69 -  AST 10 - 35 U/L 33 19 -  ALT 9 -  46 U/L 31 16 -    Lab Results  Component Value Date   TSH 1.90 11/30/2019   Lab Results  Component Value Date   ALBUMIN 4.3 10/26/2015   ANIONGAP 14 05/19/2014   Lab Results  Component Value Date   CHOL 205 (H) 11/30/2019   CHOL 153 10/26/2015   CHOL 100 03/16/2014   HDL 44 11/30/2019   HDL 51 10/26/2015   HDL 31 (L) 03/16/2014   LDLCALC 134 (H) 11/30/2019   LDLCALC 89 10/26/2015   LDLCALC 46 03/16/2014   CHOLHDL 4.7 11/30/2019   CHOLHDL 3.0 10/26/2015   CHOLHDL 6 12/12/2008   Lab Results  Component Value Date   TRIG 148 11/30/2019   Lab Results  Component Value Date   HGBA1C 5.2 02/04/2006      ASSESSMENT & PLAN:   Problem List Items Addressed This Visit      Respiratory   COPD (chronic obstructive pulmonary disease) ([place])    Patient has a history of smoking in the past he does not smoke anymore.  He has problem with sleep apnea and weight problem.         Digestive   GERD    Patient has problem with gastritis.  And reflux.  We will try to manage it with Protonix.  He was advised not to use any NSAID because that can make it worse.  He denies any history of rectal bleeding or vomiting any blood.      Relevant Medications   pantoprazole (PROTONIX) 40 MG tablet     Nervous and Auditory   Chronic right-sided low back pain with right-sided sciatica   Relevant Medications   ALPRAZolam (XANAX) 0.25 MG tablet   Other Relevant Orders   Ambulatory referral to Pain Clinic   Neuropathy - Primary    Patient has a neuropathy leg and right buttock.  He has a shooting pain with movement of the body it could be related to the disc problem.  He was also found to be tender in the thoracolumbar area.  Patient has seen the orthopedic surgeon in the past urologist.  I suggested a to see a pain specialist.  He need to continue with physical therapy but he does not feel like it is helping him.  I also told him that he can try chiropractors specialist to help him out if needed.  We will refer him to the pain specialist from [facility]      Relevant Medications   pantoprazole (PROTONIX) 40 MG tablet     Other   Class 1 obesity with alveolar hypoventilation, serious comorbidity, and body mass index (BMI) of 34.0 to 34.9 in adult [facility])    Patient is not using the CPAP machine.  I told him to repeat his sleep test when the pandemic of coronavirus goes down       Other Visit Diagnoses    Gastritis and duodenitis       Relevant Medications   pantoprazole (PROTONIX) 40 MG tablet      Meds ordered this encounter  Medications   ALPRAZolam (XANAX) 0.25 MG tablet    Sig: Take 1 tablet (0.25 mg total) by mouth 2 (two) times daily.    Dispense:  60 tablet    Refill:  0   pantoprazole (PROTONIX) 40 MG tablet    Sig: Take 1 tablet (40 mg total) by mouth daily.    Dispense:  30 tablet    Refill:  3    Follow-up: No follow-ups  on file.    Cletis Athens, MD [facility] [address], [place], [place] [address]   By signing my name below, I, [employer], attest that this documentation has been prepared under the direction and in the presence of Dr. Cletis Athens Electronically Signed: Cletis Athens, MD 02/13/20, 4:15 PM   I personally performed the services described in this documentation, which was SCRIBED in my presence. The recorded information has been reviewed and considered accurate. It has been edited as necessary during review. Cletis Athens, MD

## 2020-02-13 NOTE — Assessment & Plan Note (Signed)
Patient has a history of smoking in the past he does not smoke anymore.  He has problem with sleep apnea and weight problem.

## 2020-02-13 NOTE — Assessment & Plan Note (Signed)
Patient has a neuropathy leg and right buttock.  He has a shooting pain with movement of the body it could be related to the disc problem.  He was also found to be tender in the thoracolumbar area.  Patient has seen the orthopedic surgeon in the past urologist.  I suggested a to see a pain specialist.  He need to continue with physical therapy but he does not feel like it is helping him.  I also told him that he can try chiropractors specialist to help him out if needed.  We will refer him to the pain specialist from [facility]

## 2020-02-13 NOTE — Assessment & Plan Note (Signed)
Patient is not using the CPAP machine.  I told him to repeat his sleep test when the pandemic of coronavirus goes down

## 2020-02-13 NOTE — Assessment & Plan Note (Signed)
Patient has problem with gastritis.  And reflux.  We will try to manage it with Protonix.  He was advised not to use any NSAID because that can make it worse.  He denies any history of rectal bleeding or vomiting any blood.

## 2020-02-27 ENCOUNTER — Encounter: Payer: Self-pay | Admitting: Endocrinology

## 2020-02-27 ENCOUNTER — Other Ambulatory Visit: Payer: Self-pay

## 2020-02-27 ENCOUNTER — Ambulatory Visit (INDEPENDENT_AMBULATORY_CARE_PROVIDER_SITE_OTHER): Payer: BC Managed Care – PPO | Admitting: Endocrinology

## 2020-02-27 DIAGNOSIS — E278 Other specified disorders of adrenal gland: Secondary | ICD-10-CM | POA: Diagnosis not present

## 2020-02-27 MED ORDER — DEXAMETHASONE 1 MG PO TABS: 1.0000 mg | ORAL_TABLET | ORAL | 0 refills | Status: DC

## 2020-02-27 NOTE — Patient Instructions (Addendum)
Please do a 24-HR urine collection, then: You should do a "dexamethasone suppression test."  For this, you would take dexamethasone 1 mg at 10 pm (I have sent a prescription to your pharmacy), then come in for a "cortisol" blood test the next morning before 9 am.  You do not need to be fasting for this test.   If these tests are normal, no further testing is needed.

## 2020-02-27 NOTE — Progress Notes (Signed)
Subjective:    Patient ID: Richard Burton, male    DOB: April 02, 1956, 64 y.o.   MRN: [id number]  HPI Pt is referred by Dr Bernardo Heater, for adrenal nodule.  He has h/o HTN.  He has no h/o diabetes, MTC, alcohol withdrawal, neurofibromas, hemangiomas, brain aneurysm, cocaine use, or thyroid disease.  He has chronic weight gain, sweating, headache, fatigue, anxiety, and insomnia.  He also has intermitt flushing of the face.   Past Medical History:  Diagnosis Date  . Anxiety and depression    a. severe - seeing psychiatry.  Marland Kitchen CAD (coronary artery disease)    a. s/p 4V CABG 2014  b. 05/2014 MV: No ischemia, EF 69%.  . Chronic prostatitis   . COPD (chronic obstructive pulmonary disease) ([place])    ongoing tobacco use  . GERD (gastroesophageal reflux disease)   . History of DVT (deep vein thrombosis)   . Hyperlipidemia   . Hyperlipidemia   . Hypertension    a. renal arteries patent on remote cath.  . Obesity   . OSA (obstructive sleep apnea)    severe  . PAD (peripheral artery disease) ([place])    a. 01/2014 ABIs R: 0.92, L 1.05.  Know total occlusion of right SFA with distal reconstitution-->Med Rx.  . Sleep apnea   . Tobacco abuse    a. 05/2014 down to 3-5 cigarettes day and vaping also.    Past Surgical History:  Procedure Laterality Date  . CHOLECYSTECTOMY    . CORONARY ARTERY BYPASS GRAFT  October 2014   4V  . CYSTOSCOPY    . KNEE ARTHROSCOPY     right meniscal tear    Social History   Socioeconomic History  . Marital status: Married    Spouse name: Not on file  . Number of children: Not on file  . Years of education: Not on file  . Highest education level: Not on file  Occupational History  . Not on file  Tobacco Use  . Smoking status: Former Smoker    Packs/day: 1.00    Years: 40.00    Pack years: 40.00    Types: Cigarettes    Quit date: 08/11/2014    Years since quitting: 5.5  . Smokeless tobacco: Never Used  Vaping Use  . Vaping Use: Never used  Substance and  Sexual Activity  . Alcohol use: No    Alcohol/week: 0.0 standard drinks  . Drug use: No  . Sexual activity: Yes    Birth control/protection: None  Other Topics Concern  . Not on file  Social History Narrative  . Not on file   Social Determinants of Health   Financial Resource Strain:   . Difficulty of Paying Living Expenses: Not on file  Food Insecurity:   . Worried About Charity fundraiser in the Last Year: Not on file  . Ran Out of Food in the Last Year: Not on file  Transportation Needs:   . Lack of Transportation (Medical): Not on file  . Lack of Transportation (Non-Medical): Not on file  Physical Activity:   . Days of Exercise per Week: Not on file  . Minutes of Exercise per Session: Not on file  Stress:   . Feeling of Stress : Not on file  Social Connections:   . Frequency of Communication with Friends and Family: Not on file  . Frequency of Social Gatherings with Friends and Family: Not on file  . Attends Religious Services: Not on file  . Active Member  of Clubs or Organizations: Not on file  . Attends Archivist Meetings: Not on file  . Marital Status: Not on file  Intimate Partner Violence:   . Fear of Current or Ex-Partner: Not on file  . Emotionally Abused: Not on file  . Physically Abused: Not on file  . Sexually Abused: Not on file    Current Outpatient Medications on File Prior to Visit  Medication Sig Dispense Refill  . ALPRAZolam (XANAX) 0.25 MG tablet Take 1 tablet (0.25 mg total) by mouth 2 (two) times daily. 60 tablet 0  . Ascorbic Acid (VITAMIN C PO) Take by mouth.    Marland Kitchen aspirin EC 81 MG tablet Take by mouth.    Marland Kitchen atorvastatin (LIPITOR) 80 MG tablet Take 1 tablet (80 mg total) by mouth daily. 90 tablet 3  . B Complex Vitamins (B COMPLEX PO) Take by mouth.    . clopidogrel (PLAVIX) 75 MG tablet Take 1 tablet (75 mg total) by mouth daily. 90 tablet 3  . losartan (COZAAR) 100 MG tablet Take 100 mg by mouth daily.    Marland Kitchen MAGNESIUM PO Take by  mouth.    . Menaquinone-7 (VITAMIN K2 PO) Take by mouth.    . Multiple Vitamins-Minerals (ZINC PO) Take by mouth.    . nitroGLYCERIN (NITROSTAT) 0.4 MG SL tablet Place 1 tablet (0.4 mg total) under the tongue every 5 (five) minutes as needed for chest pain. 50 tablet 3  . pantoprazole (PROTONIX) 40 MG tablet Take 1 tablet (40 mg total) by mouth daily. 30 tablet 6  . sertraline (ZOLOFT) 100 MG tablet TAKE 1 1/2 TABLETS BY MOUTH EVERY DAY 45 tablet 0  . VITAMIN D PO Take by mouth.     No current facility-administered medications on file prior to visit.    Allergies  Allergen Reactions  . Sulfa Antibiotics Diarrhea  . Relafen [Nabumetone] Other (See Comments)    Pt doesn't remember     Family History  Problem Relation Age of Onset  . Hypertension Mother   . Cataracts Mother   . Diabetes Father   . Adrenal disorder Neg Hx     BP 134/80   Pulse 75   Ht 5\' 10"  (1.778 m)   Wt 267 lb (121.1 kg)   SpO2 95%   BMI 38.31 kg/m    Review of Systems denies pallor, n/v, syncope, diarrhea, chest pain, sob, visual loss, palpitations, and fever.      Objective:   Physical Exam VS: see vs page GEN: no distress HEAD: head: no deformity eyes: no periorbital swelling, no proptosis external nose and ears are normal Mouth: no neurofibromata of the tongue NECK: supple, thyroid is not enlarged CHEST WALL: no deformity LUNGS: clear to auscultation CV: reg rate and rhythm, no murmur.  MUSCULOSKELETAL: gait is normal and steady EXTEMITIES: no deformity.  no leg edema.   NEURO:  cn 2-12 grossly intact.   readily moves all 4's.  sensation is intact to touch on all 4's SKIN:  Normal texture and temperature.  No rash or suspicious lesion is visible.   NODES:  None palpable at the neck PSYCH: alert, well-oriented.  Does not appear anxious nor depressed.    CT: Fluid density mass in the left adrenal gland measures 3.8 cm, increased from 1.8 cm on 12/10/2005.    I have reviewed outside records,  and summarized: Pt was eval for urolithiasis, and adrenal cyst was incidentally noted.  He was referred here.  Assessment & Plan:  Adrenal cyst, new to me, uncertain etiology and prognosis.   Patient Instructions  Please do a 24-HR urine collection, then: You should do a "dexamethasone suppression test."  For this, you would take dexamethasone 1 mg at 10 pm (I have sent a prescription to your pharmacy), then come in for a "cortisol" blood test the next morning before 9 am.  You do not need to be fasting for this test.   If these tests are normal, no further testing is needed.

## 2020-03-07 DIAGNOSIS — M62451 Contracture of muscle, right thigh: Secondary | ICD-10-CM | POA: Diagnosis not present

## 2020-03-07 DIAGNOSIS — M62830 Muscle spasm of back: Secondary | ICD-10-CM | POA: Diagnosis not present

## 2020-03-07 DIAGNOSIS — M9902 Segmental and somatic dysfunction of thoracic region: Secondary | ICD-10-CM | POA: Diagnosis not present

## 2020-03-07 DIAGNOSIS — M9903 Segmental and somatic dysfunction of lumbar region: Secondary | ICD-10-CM | POA: Diagnosis not present

## 2020-03-07 DIAGNOSIS — S39012A Strain of muscle, fascia and tendon of lower back, initial encounter: Secondary | ICD-10-CM | POA: Diagnosis not present

## 2020-03-08 DIAGNOSIS — M9903 Segmental and somatic dysfunction of lumbar region: Secondary | ICD-10-CM | POA: Diagnosis not present

## 2020-03-08 DIAGNOSIS — M62451 Contracture of muscle, right thigh: Secondary | ICD-10-CM | POA: Diagnosis not present

## 2020-03-08 DIAGNOSIS — M62830 Muscle spasm of back: Secondary | ICD-10-CM | POA: Diagnosis not present

## 2020-03-08 DIAGNOSIS — M9902 Segmental and somatic dysfunction of thoracic region: Secondary | ICD-10-CM | POA: Diagnosis not present

## 2020-03-08 DIAGNOSIS — S39012A Strain of muscle, fascia and tendon of lower back, initial encounter: Secondary | ICD-10-CM | POA: Diagnosis not present

## 2020-03-12 DIAGNOSIS — S39012A Strain of muscle, fascia and tendon of lower back, initial encounter: Secondary | ICD-10-CM | POA: Diagnosis not present

## 2020-03-12 DIAGNOSIS — M9903 Segmental and somatic dysfunction of lumbar region: Secondary | ICD-10-CM | POA: Diagnosis not present

## 2020-03-12 DIAGNOSIS — M9902 Segmental and somatic dysfunction of thoracic region: Secondary | ICD-10-CM | POA: Diagnosis not present

## 2020-03-12 DIAGNOSIS — M62830 Muscle spasm of back: Secondary | ICD-10-CM | POA: Diagnosis not present

## 2020-03-12 DIAGNOSIS — M62451 Contracture of muscle, right thigh: Secondary | ICD-10-CM | POA: Diagnosis not present

## 2020-03-15 DIAGNOSIS — M62451 Contracture of muscle, right thigh: Secondary | ICD-10-CM | POA: Diagnosis not present

## 2020-03-15 DIAGNOSIS — M9902 Segmental and somatic dysfunction of thoracic region: Secondary | ICD-10-CM | POA: Diagnosis not present

## 2020-03-15 DIAGNOSIS — S39012A Strain of muscle, fascia and tendon of lower back, initial encounter: Secondary | ICD-10-CM | POA: Diagnosis not present

## 2020-03-15 DIAGNOSIS — M62830 Muscle spasm of back: Secondary | ICD-10-CM | POA: Diagnosis not present

## 2020-03-15 DIAGNOSIS — M9903 Segmental and somatic dysfunction of lumbar region: Secondary | ICD-10-CM | POA: Diagnosis not present

## 2020-03-19 ENCOUNTER — Ambulatory Visit: Payer: BC Managed Care – PPO | Admitting: Student in an Organized Health Care Education/Training Program

## 2020-03-20 DIAGNOSIS — S39012A Strain of muscle, fascia and tendon of lower back, initial encounter: Secondary | ICD-10-CM | POA: Diagnosis not present

## 2020-03-20 DIAGNOSIS — M9902 Segmental and somatic dysfunction of thoracic region: Secondary | ICD-10-CM | POA: Diagnosis not present

## 2020-03-20 DIAGNOSIS — M62451 Contracture of muscle, right thigh: Secondary | ICD-10-CM | POA: Diagnosis not present

## 2020-03-20 DIAGNOSIS — M62830 Muscle spasm of back: Secondary | ICD-10-CM | POA: Diagnosis not present

## 2020-03-20 DIAGNOSIS — M9903 Segmental and somatic dysfunction of lumbar region: Secondary | ICD-10-CM | POA: Diagnosis not present

## 2020-03-26 DIAGNOSIS — M9902 Segmental and somatic dysfunction of thoracic region: Secondary | ICD-10-CM | POA: Diagnosis not present

## 2020-03-26 DIAGNOSIS — M62830 Muscle spasm of back: Secondary | ICD-10-CM | POA: Diagnosis not present

## 2020-03-26 DIAGNOSIS — M62451 Contracture of muscle, right thigh: Secondary | ICD-10-CM | POA: Diagnosis not present

## 2020-03-26 DIAGNOSIS — S39012A Strain of muscle, fascia and tendon of lower back, initial encounter: Secondary | ICD-10-CM | POA: Diagnosis not present

## 2020-03-26 DIAGNOSIS — M9903 Segmental and somatic dysfunction of lumbar region: Secondary | ICD-10-CM | POA: Diagnosis not present

## 2020-03-27 ENCOUNTER — Other Ambulatory Visit: Payer: Self-pay

## 2020-03-27 ENCOUNTER — Ambulatory Visit (INDEPENDENT_AMBULATORY_CARE_PROVIDER_SITE_OTHER): Payer: BC Managed Care – PPO | Admitting: Internal Medicine

## 2020-03-27 ENCOUNTER — Encounter: Payer: Self-pay | Admitting: Internal Medicine

## 2020-03-27 VITALS — BP 138/82 | HR 84 | Ht 72.0 in | Wt 270.9 lb

## 2020-03-27 DIAGNOSIS — Z23 Encounter for immunization: Secondary | ICD-10-CM | POA: Diagnosis not present

## 2020-03-27 DIAGNOSIS — J449 Chronic obstructive pulmonary disease, unspecified: Secondary | ICD-10-CM | POA: Diagnosis not present

## 2020-03-27 DIAGNOSIS — Z6834 Body mass index (BMI) 34.0-34.9, adult: Secondary | ICD-10-CM

## 2020-03-27 DIAGNOSIS — I10 Essential (primary) hypertension: Secondary | ICD-10-CM

## 2020-03-27 DIAGNOSIS — E662 Morbid (severe) obesity with alveolar hypoventilation: Secondary | ICD-10-CM

## 2020-03-27 DIAGNOSIS — K219 Gastro-esophageal reflux disease without esophagitis: Secondary | ICD-10-CM | POA: Diagnosis not present

## 2020-03-27 MED ORDER — ALPRAZOLAM 0.5 MG PO TABS: 0.5000 mg | ORAL_TABLET | Freq: Two times a day (BID) | ORAL | 0 refills | Status: DC

## 2020-03-27 NOTE — Assessment & Plan Note (Signed)
Patient has been continuously trying to lose weight.  He was suggested [employer].

## 2020-03-27 NOTE — Assessment & Plan Note (Signed)
Blood pressure is stable on the present therapeutic regimen.

## 2020-03-27 NOTE — Progress Notes (Signed)
Established Patient Office Visit  Subjective:  Patient ID: Richard Burton, male    DOB: May 03, 1956  Age: 64 y.o. MRN: [id number]  CC:  Chief Complaint  Patient presents with   Hypertension    Hypertension This is a recurrent problem. The current episode started more than 1 year ago. Associated symptoms include anxiety and headaches. Pertinent negatives include no chest pain, neck pain, orthopnea, peripheral edema, PND, shortness of breath or sweats. Agents associated with hypertension include NSAIDs. Risk factors for coronary artery disease include male gender, dyslipidemia and obesity. Hypertensive end-organ damage includes CAD/MI. There is no history of angina or CVA.    Richard Burton presents for back pain , and GEN check up and  Hypertension  check  Past Medical History:  Diagnosis Date   Anxiety and depression    a. severe - seeing psychiatry.   CAD (coronary artery disease)    a. s/p 4V CABG 2014  b. 05/2014 MV: No ischemia, EF 69%.   Chronic prostatitis    COPD (chronic obstructive pulmonary disease) ([facility])    ongoing tobacco use   GERD (gastroesophageal reflux disease)    History of DVT (deep vein thrombosis)    Hyperlipidemia    Hyperlipidemia    Hypertension    a. renal arteries patent on remote cath.   Obesity    OSA (obstructive sleep apnea)    severe   PAD (peripheral artery disease) ([place])    a. 01/2014 ABIs R: 0.92, L 1.05.  Know total occlusion of right SFA with distal reconstitution-->Med Rx.   Sleep apnea    Tobacco abuse    a. 05/2014 down to 3-5 cigarettes day and vaping also.    Past Surgical History:  Procedure Laterality Date   CHOLECYSTECTOMY     CORONARY ARTERY BYPASS GRAFT  October 2014   4V   CYSTOSCOPY     KNEE ARTHROSCOPY     right meniscal tear    Family History  Problem Relation Age of Onset   Hypertension Mother    Cataracts Mother    Diabetes Father    Adrenal disorder Neg Hx     Social History    Socioeconomic History   Marital status: Married    Spouse name: Not on file   Number of children: Not on file   Years of education: Not on file   Highest education level: Not on file  Occupational History   Not on file  Tobacco Use   Smoking status: Former Smoker    Packs/day: 1.00    Years: 40.00    Pack years: 40.00    Types: Cigarettes    Quit date: 08/11/2014    Years since quitting: 5.6   Smokeless tobacco: Never Used  Vaping Use   Vaping Use: Never used  Substance and Sexual Activity   Alcohol use: No    Alcohol/week: 0.0 standard drinks   Drug use: No   Sexual activity: Yes    Birth control/protection: None  Other Topics Concern   Not on file  Social History Narrative   Not on file   Social Determinants of Health   Financial Resource Strain:    Difficulty of Paying Living Expenses: Not on file  Food Insecurity:    Worried About Charity fundraiser in the Last Year: Not on file   [employer] of Food in the Last Year: Not on file  Transportation Needs:    Lack of Transportation (Medical): Not on file  Lack of Transportation (Non-Medical): Not on file  Physical Activity:    Days of Exercise per Week: Not on file   Minutes of Exercise per Session: Not on file  Stress:    Feeling of Stress : Not on file  Social Connections:    Frequency of Communication with Friends and Family: Not on file   Frequency of Social Gatherings with Friends and Family: Not on file   Attends Religious Services: Not on file   Active Member of Clubs or Organizations: Not on file   Attends Archivist Meetings: Not on file   Marital Status: Not on file  Intimate Partner Violence:    Fear of Current or Ex-Partner: Not on file   Emotionally Abused: Not on file   Physically Abused: Not on file   Sexually Abused: Not on file     Current Outpatient Medications:    Ascorbic Acid (VITAMIN C PO), Take by mouth., Disp: , Rfl:    aspirin EC 81 MG  tablet, Take by mouth., Disp: , Rfl:    atorvastatin (LIPITOR) 80 MG tablet, Take 1 tablet (80 mg total) by mouth daily., Disp: 90 tablet, Rfl: 3   B Complex Vitamins (B COMPLEX PO), Take by mouth., Disp: , Rfl:    clopidogrel (PLAVIX) 75 MG tablet, Take 1 tablet (75 mg total) by mouth daily., Disp: 90 tablet, Rfl: 3   dexamethasone (DECADRON) 1 MG tablet, Take 1 tablet (1 mg total) by mouth See admin instructions. Take at 9-10 PM, the night before blood test, Disp: 1 tablet, Rfl: 0   losartan (COZAAR) 100 MG tablet, Take 100 mg by mouth daily., Disp: , Rfl:    MAGNESIUM PO, Take by mouth., Disp: , Rfl:    Menaquinone-7 (VITAMIN K2 PO), Take by mouth., Disp: , Rfl:    Multiple Vitamins-Minerals (ZINC PO), Take by mouth., Disp: , Rfl:    nitroGLYCERIN (NITROSTAT) 0.4 MG SL tablet, Place 1 tablet (0.4 mg total) under the tongue every 5 (five) minutes as needed for chest pain., Disp: 50 tablet, Rfl: 3   pantoprazole (PROTONIX) 40 MG tablet, Take 1 tablet (40 mg total) by mouth daily., Disp: 30 tablet, Rfl: 6   sertraline (ZOLOFT) 100 MG tablet, TAKE 1 1/2 TABLETS BY MOUTH EVERY DAY, Disp: 45 tablet, Rfl: 0   VITAMIN D PO, Take by mouth., Disp: , Rfl:    ALPRAZolam (XANAX) 0.5 MG tablet, Take 1 tablet (0.5 mg total) by mouth 2 (two) times daily., Disp: 60 tablet, Rfl: 0   Allergies  Allergen Reactions   Sulfa Antibiotics Diarrhea   Relafen [Nabumetone] Other (See Comments)    Pt doesn't remember     ROS Review of Systems  Constitutional: Negative.   HENT: Negative.   Eyes: Negative.   Respiratory: Negative.  Negative for shortness of breath.   Cardiovascular: Negative.  Negative for chest pain, orthopnea and PND.  Gastrointestinal: Negative.   Endocrine: Negative.   Genitourinary: Negative.   Musculoskeletal: Positive for arthralgias and back pain. Negative for joint swelling, myalgias and neck pain.  Skin: Negative.   Allergic/Immunologic: Negative.   Neurological:  Positive for headaches. Negative for syncope and speech difficulty.  Hematological: Negative.   Psychiatric/Behavioral: Negative.  The patient is not hyperactive.   All other systems reviewed and are negative.     Objective:    Physical Exam Vitals reviewed.  Constitutional:      Appearance: Normal appearance.  HENT:     Mouth/Throat:  Mouth: Mucous membranes are moist.  Eyes:     Pupils: Pupils are equal, round, and reactive to light.  Neck:     Vascular: No carotid bruit.  Cardiovascular:     Rate and Rhythm: Normal rate and regular rhythm.     Pulses: Normal pulses.     Heart sounds: Normal heart sounds.  Pulmonary:     Effort: Pulmonary effort is normal.     Breath sounds: Normal breath sounds.  Abdominal:     General: Bowel sounds are normal.     Palpations: Abdomen is soft. There is no hepatomegaly, splenomegaly or mass.     Tenderness: There is no abdominal tenderness.     Hernia: No hernia is present.  Musculoskeletal:     Cervical back: Neck supple.     Right lower leg: No edema.     Left lower leg: No edema.  Skin:    Findings: No rash.  Neurological:     Mental Status: He is alert and oriented to person, place, and time.     Motor: No weakness.  Psychiatric:        Mood and Affect: Mood normal.        Behavior: Behavior normal.     BP 138/82    Pulse 84    Ht 6' (1.829 m)    Wt 270 lb 14.4 oz (122.9 kg)    BMI 36.74 kg/m  Wt Readings from Last 3 Encounters:  03/27/20 270 lb 14.4 oz (122.9 kg)  02/27/20 267 lb (121.1 kg)  02/13/20 266 lb 14.4 oz (121.1 kg)     Health Maintenance Due  Topic Date Due   Hepatitis C Screening  Never done   HIV Screening  Never done   COLONOSCOPY  Never done   TETANUS/TDAP  12/19/2014   INFLUENZA VACCINE  12/11/2019    There are no preventive care reminders to display for this patient.  Lab Results  Component Value Date   TSH 1.90 11/30/2019   Lab Results  Component Value Date   WBC 7.0 11/30/2019     HGB 16.2 11/30/2019   HCT 47.6 11/30/2019   MCV 89.5 11/30/2019   PLT 217 11/30/2019   Lab Results  Component Value Date   NA 142 11/30/2019   K 5.4 (H) 11/30/2019   CO2 21 11/30/2019   GLUCOSE 135 (H) 11/30/2019   BUN 16 11/30/2019   CREATININE 1.06 11/30/2019   BILITOT 0.4 11/30/2019   ALKPHOS 69 10/26/2015   AST 33 11/30/2019   ALT 31 11/30/2019   PROT 7.3 11/30/2019   ALBUMIN 4.3 10/26/2015   CALCIUM 9.7 11/30/2019   ANIONGAP 14 05/19/2014   Lab Results  Component Value Date   CHOL 205 (H) 11/30/2019   Lab Results  Component Value Date   HDL 44 11/30/2019   Lab Results  Component Value Date   LDLCALC 134 (H) 11/30/2019   Lab Results  Component Value Date   TRIG 148 11/30/2019   Lab Results  Component Value Date   CHOLHDL 4.7 11/30/2019   Lab Results  Component Value Date   HGBA1C 5.2 02/04/2006      Assessment & Plan:   Problem List Items Addressed This Visit      Cardiovascular and Mediastinum   Essential hypertension    Blood pressure is stable on the present therapeutic regimen.        Respiratory   Chronic obstructive pulmonary disease ([place])    - I instructed the patient  to stop smoking and provided them with smoking cessation materials.  - I informed the patient that smoking puts them at increased risk for cancer, COPD, hypertension, and more.  - Informed the patient to seek help if they begin to have trouble breathing, develop chest pain, start to cough up blood, feel faint, or pass out.          Digestive   Gastroesophageal reflux disease without esophagitis    Reflux is stable now, he has been trying to lose weight.        Other   Class 1 obesity with alveolar hypoventilation, serious comorbidity, and body mass index (BMI) of 34.0 to 34.9 in adult [facility])    Patient has been continuously trying to lose weight.  He was suggested [employer].      Need for COVID-19 vaccine - Primary    He has been vaccinated against  Covid.      Relevant Orders   Pfizer SARS-COV-2 Vaccine (Completed)      Meds ordered this encounter  Medications   ALPRAZolam (XANAX) 0.5 MG tablet    Sig: Take 1 tablet (0.5 mg total) by mouth 2 (two) times daily.    Dispense:  60 tablet    Refill:  0    Follow-up: No follow-ups on file.    Cletis Athens, MD

## 2020-03-27 NOTE — Assessment & Plan Note (Signed)
-   I instructed the patient to stop smoking and provided them with smoking cessation materials.  - I informed the patient that smoking puts them at increased risk for cancer, COPD, hypertension, and more.  - Informed the patient to seek help if they begin to have trouble breathing, develop chest pain, start to cough up blood, feel faint, or pass out.  

## 2020-03-27 NOTE — Assessment & Plan Note (Signed)
He has been vaccinated against Covid.

## 2020-03-27 NOTE — Assessment & Plan Note (Signed)
Reflux is stable now, he has been trying to lose weight.

## 2020-03-29 ENCOUNTER — Other Ambulatory Visit: Payer: Self-pay | Admitting: Internal Medicine

## 2020-03-29 DIAGNOSIS — M9903 Segmental and somatic dysfunction of lumbar region: Secondary | ICD-10-CM | POA: Diagnosis not present

## 2020-03-29 DIAGNOSIS — S39012A Strain of muscle, fascia and tendon of lower back, initial encounter: Secondary | ICD-10-CM | POA: Diagnosis not present

## 2020-03-29 DIAGNOSIS — M62451 Contracture of muscle, right thigh: Secondary | ICD-10-CM | POA: Diagnosis not present

## 2020-03-29 DIAGNOSIS — M9902 Segmental and somatic dysfunction of thoracic region: Secondary | ICD-10-CM | POA: Diagnosis not present

## 2020-03-29 DIAGNOSIS — M62830 Muscle spasm of back: Secondary | ICD-10-CM | POA: Diagnosis not present

## 2020-03-30 ENCOUNTER — Other Ambulatory Visit: Payer: Self-pay | Admitting: *Deleted

## 2020-03-30 MED ORDER — CLOPIDOGREL BISULFATE 75 MG PO TABS
75.0000 mg | ORAL_TABLET | Freq: Every day | ORAL | 3 refills | Status: DC
Start: 2020-03-30 — End: 2021-04-22

## 2020-04-02 DIAGNOSIS — M9902 Segmental and somatic dysfunction of thoracic region: Secondary | ICD-10-CM | POA: Diagnosis not present

## 2020-04-02 DIAGNOSIS — M62830 Muscle spasm of back: Secondary | ICD-10-CM | POA: Diagnosis not present

## 2020-04-02 DIAGNOSIS — M62451 Contracture of muscle, right thigh: Secondary | ICD-10-CM | POA: Diagnosis not present

## 2020-04-02 DIAGNOSIS — S39012A Strain of muscle, fascia and tendon of lower back, initial encounter: Secondary | ICD-10-CM | POA: Diagnosis not present

## 2020-04-02 DIAGNOSIS — M9903 Segmental and somatic dysfunction of lumbar region: Secondary | ICD-10-CM | POA: Diagnosis not present

## 2020-04-12 DIAGNOSIS — M9902 Segmental and somatic dysfunction of thoracic region: Secondary | ICD-10-CM | POA: Diagnosis not present

## 2020-04-12 DIAGNOSIS — M9903 Segmental and somatic dysfunction of lumbar region: Secondary | ICD-10-CM | POA: Diagnosis not present

## 2020-04-12 DIAGNOSIS — S39012A Strain of muscle, fascia and tendon of lower back, initial encounter: Secondary | ICD-10-CM | POA: Diagnosis not present

## 2020-04-12 DIAGNOSIS — M62830 Muscle spasm of back: Secondary | ICD-10-CM | POA: Diagnosis not present

## 2020-04-12 DIAGNOSIS — M62451 Contracture of muscle, right thigh: Secondary | ICD-10-CM | POA: Diagnosis not present

## 2020-04-26 DIAGNOSIS — M62451 Contracture of muscle, right thigh: Secondary | ICD-10-CM | POA: Diagnosis not present

## 2020-04-26 DIAGNOSIS — M9903 Segmental and somatic dysfunction of lumbar region: Secondary | ICD-10-CM | POA: Diagnosis not present

## 2020-04-26 DIAGNOSIS — S39012A Strain of muscle, fascia and tendon of lower back, initial encounter: Secondary | ICD-10-CM | POA: Diagnosis not present

## 2020-04-26 DIAGNOSIS — M9902 Segmental and somatic dysfunction of thoracic region: Secondary | ICD-10-CM | POA: Diagnosis not present

## 2020-05-22 ENCOUNTER — Other Ambulatory Visit: Payer: Self-pay

## 2020-05-22 ENCOUNTER — Ambulatory Visit (INDEPENDENT_AMBULATORY_CARE_PROVIDER_SITE_OTHER): Payer: No Typology Code available for payment source | Admitting: Internal Medicine

## 2020-05-22 DIAGNOSIS — Z1152 Encounter for screening for COVID-19: Secondary | ICD-10-CM | POA: Diagnosis not present

## 2020-05-22 LAB — POC COVID19 BINAXNOW: SARS Coronavirus 2 Ag: NEGATIVE

## 2020-05-22 MED ORDER — AZITHROMYCIN 250 MG PO TABS: ORAL_TABLET | ORAL | 0 refills | Status: DC

## 2020-05-25 ENCOUNTER — Telehealth: Payer: Self-pay | Admitting: Cardiovascular Disease
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
## 2020-05-25 NOTE — Telephone Encounter (Signed)
  Pt c/o of Chest Pain: STAT if CP now or developed within 24 hours  1. Are you having CP right now? Not right now, but was having it really bad yesterday along with back pain between shoulder blades. Back pain started first then chest started hurting. Episode lasted about an hour. Pain felt like shooting or pulling type pain.   2. Are you experiencing any other symptoms (ex. SOB, nausea, vomiting, sweating)? Sweating with low activity, shortness of breath, some nausea at times, dizziness the other morning  3. How long have you been experiencing CP? yesterday  4. Is your CP continuous or coming and going? On and off  5. Have you taken Nitroglycerin? Have not taken any ?

## 2020-06-05 ENCOUNTER — Other Ambulatory Visit: Payer: Self-pay

## 2020-06-05 ENCOUNTER — Encounter: Payer: Self-pay | Admitting: Internal Medicine

## 2020-06-05 ENCOUNTER — Ambulatory Visit (INDEPENDENT_AMBULATORY_CARE_PROVIDER_SITE_OTHER): Payer: No Typology Code available for payment source | Admitting: Internal Medicine

## 2020-06-05 VITALS — BP 167/90 | HR 70 | Ht 71.0 in | Wt 274.5 lb

## 2020-06-05 DIAGNOSIS — I10 Essential (primary) hypertension: Secondary | ICD-10-CM | POA: Diagnosis not present

## 2020-06-05 DIAGNOSIS — I25810 Atherosclerosis of coronary artery bypass graft(s) without angina pectoris: Secondary | ICD-10-CM | POA: Diagnosis not present

## 2020-06-05 DIAGNOSIS — G4733 Obstructive sleep apnea (adult) (pediatric): Secondary | ICD-10-CM

## 2020-06-05 DIAGNOSIS — J449 Chronic obstructive pulmonary disease, unspecified: Secondary | ICD-10-CM | POA: Diagnosis not present

## 2020-06-05 DIAGNOSIS — Z23 Encounter for immunization: Secondary | ICD-10-CM | POA: Diagnosis not present

## 2020-06-05 NOTE — Assessment & Plan Note (Signed)
Patient complains of shortness of breath on exertion does not have any angina.  His main problem is exogenous obesity and he is trying to lose weight.

## 2020-06-05 NOTE — Assessment & Plan Note (Signed)
Patient says that he is using the CPAP machine on a daily basis.

## 2020-06-05 NOTE — Progress Notes (Signed)
Established Patient Office Visit  Subjective:  Patient ID: Richard Burton, male    DOB: [birth year]  Age: 65 y.o. MRN: [id number]  CC:  Chief Complaint  Patient presents with  . Anxiety    Refill on xanax     HPI  Richard Burton presents for general checkup.  He is known to have anxiety with depression coronary artery disease has a history of coronary artery bypass surgery in 2014.  Denies any history of chest pain.  Patient other problems include chronic prostatitis COPD and exogenous obesity.   Past Surgical History:  Procedure Laterality Date  . CHOLECYSTECTOMY    . CORONARY ARTERY BYPASS GRAFT  October 2014   4V  . CYSTOSCOPY    . KNEE ARTHROSCOPY     right meniscal tear    Family History  Problem Relation Age of Onset  . Hypertension Mother   . Cataracts Mother   . Diabetes Father   . Adrenal disorder Neg Hx     Social History   Socioeconomic History  . Marital status: Married    Spouse name: Not on file  . Number of children: Not on file  . Years of education: Not on file  . Highest education level: Not on file  Occupational History  . Not on file  Tobacco Use  . Smoking status: Former Smoker    Packs/day: 1.00    Years: 40.00    Pack years: 40.00    Types: Cigarettes    Quit date: 08/11/2014    Years since quitting: 5.8  . Smokeless tobacco: Never Used  Vaping Use  . Vaping Use: Never used  Substance and Sexual Activity  . Alcohol use: No    Alcohol/week: 0.0 standard drinks  . Drug use: No  . Sexual activity: Yes    Birth control/protection: None  Other Topics Concern  . Not on file  Social History Narrative  . Not on file   Social Determinants of Health   Financial Resource Strain: Not on file  Food Insecurity: Not on file  Transportation Needs: Not on file  Physical Activity: Not on file  Stress: Not on file  Social Connections: Not on file  Intimate Partner Violence: Not on file     Current Outpatient Medications:  .   ALPRAZolam (XANAX) 0.5 MG tablet, Take 1 tablet (0.5 mg total) by mouth 2 (two) times daily., Disp: 60 tablet, Rfl: 0 .  Ascorbic Acid (VITAMIN C PO), Take by mouth., Disp: , Rfl:  .  aspirin EC 81 MG tablet, Take by mouth., Disp: , Rfl:  .  atorvastatin (LIPITOR) 80 MG tablet, Take 1 tablet (80 mg total) by mouth daily., Disp: 90 tablet, Rfl: 3 .  azithromycin (ZITHROMAX) 250 MG tablet, Use at instructed, Disp: 6 tablet, Rfl: 0 .  B Complex Vitamins (B COMPLEX PO), Take by mouth., Disp: , Rfl:  .  clopidogrel (PLAVIX) 75 MG tablet, Take 1 tablet (75 mg total) by mouth daily., Disp: 90 tablet, Rfl: 3 .  dexamethasone (DECADRON) 1 MG tablet, Take 1 tablet (1 mg total) by mouth See admin instructions. Take at 9-10 PM, the night before blood test, Disp: 1 tablet, Rfl: 0 .  losartan (COZAAR) 100 MG tablet, Take 100 mg by mouth daily., Disp: , Rfl:  .  MAGNESIUM PO, Take by mouth., Disp: , Rfl:  .  Menaquinone-7 (VITAMIN K2 PO), Take by mouth., Disp: , Rfl:  .  Multiple Vitamins-Minerals (ZINC PO), Take by mouth., Disp: ,  Rfl:  .  nitroGLYCERIN (NITROSTAT) 0.4 MG SL tablet, Place 1 tablet (0.4 mg total) under the tongue every 5 (five) minutes as needed for chest pain., Disp: 50 tablet, Rfl: 3 .  pantoprazole (PROTONIX) 40 MG tablet, Take 1 tablet (40 mg total) by mouth daily., Disp: 30 tablet, Rfl: 6 .  sertraline (ZOLOFT) 100 MG tablet, TAKE 1 1/2 TABLETS BY MOUTH EVERY DAY, Disp: 45 tablet, Rfl: 0 .  VITAMIN D PO, Take by mouth., Disp: , Rfl:    Allergies  Allergen Reactions  . Sulfa Antibiotics Diarrhea  . Relafen [Nabumetone] Other (See Comments)    Pt doesn't remember     ROS Review of Systems  Constitutional: Negative.   HENT: Negative.   Eyes: Negative.   Respiratory: Negative.   Cardiovascular: Negative.   Gastrointestinal: Negative.   Endocrine: Negative.   Genitourinary: Negative.   Musculoskeletal: Negative.   Skin: Negative.   Allergic/Immunologic: Negative.    Neurological: Negative.   Hematological: Negative.   Psychiatric/Behavioral: Negative.   All other systems reviewed and are negative.     Objective:    Physical Exam Vitals reviewed.  Constitutional:      Appearance: Normal appearance.  HENT:     Mouth/Throat:     Mouth: Mucous membranes are moist.  Eyes:     Pupils: Pupils are equal, round, and reactive to light.  Neck:     Vascular: No carotid bruit.  Cardiovascular:     Rate and Rhythm: Normal rate and regular rhythm.     Pulses: Normal pulses.     Heart sounds: Normal heart sounds.  Pulmonary:     Effort: Pulmonary effort is normal.     Breath sounds: Normal breath sounds.  Abdominal:     General: Bowel sounds are normal.     Palpations: Abdomen is soft. There is no hepatomegaly, splenomegaly or mass.     Tenderness: There is no abdominal tenderness.     Hernia: No hernia is present.  Musculoskeletal:     Cervical back: Neck supple.     Right lower leg: No edema.     Left lower leg: No edema.  Skin:    Findings: No rash.  Neurological:     Mental Status: He is alert and oriented to person, place, and time.     Motor: No weakness.  Psychiatric:        Mood and Affect: Mood normal.        Behavior: Behavior normal.     BP (!) 167/90   Pulse 70   Ht 5\' 11"  (1.803 m)   Wt 274 lb 8 oz (124.5 kg)   BMI 38.28 kg/m  Wt Readings from Last 3 Encounters:  06/05/20 274 lb 8 oz (124.5 kg)  03/27/20 270 lb 14.4 oz (122.9 kg)  02/27/20 267 lb (121.1 kg)     Health Maintenance Due  Topic Date Due  . Hepatitis C Screening  Never done  . HIV Screening  Never done  . COLONOSCOPY (Pts 45-66yrs Insurance coverage will need to be confirmed)  Never done  . TETANUS/TDAP  12/19/2014  . INFLUENZA VACCINE  12/11/2019    There are no preventive care reminders to display for this patient.  Lab Results  Component Value Date   TSH 1.90 11/30/2019   Lab Results  Component Value Date   WBC 7.0 11/30/2019   HGB 16.2  11/30/2019   HCT 47.6 11/30/2019   MCV 89.5 11/30/2019   PLT 217 11/30/2019  Lab Results  Component Value Date   NA 142 11/30/2019   K 5.4 (H) 11/30/2019   CO2 21 11/30/2019   GLUCOSE 135 (H) 11/30/2019   BUN 16 11/30/2019   CREATININE 1.06 11/30/2019   BILITOT 0.4 11/30/2019   ALKPHOS 69 10/26/2015   AST 33 11/30/2019   ALT 31 11/30/2019   PROT 7.3 11/30/2019   ALBUMIN 4.3 10/26/2015   CALCIUM 9.7 11/30/2019   ANIONGAP 14 05/19/2014   Lab Results  Component Value Date   CHOL 205 (H) 11/30/2019   Lab Results  Component Value Date   HDL 44 11/30/2019   Lab Results  Component Value Date   LDLCALC 134 (H) 11/30/2019   Lab Results  Component Value Date   TRIG 148 11/30/2019   Lab Results  Component Value Date   CHOLHDL 4.7 11/30/2019   Lab Results  Component Value Date   HGBA1C 5.2 02/04/2006      Assessment & Plan:   Problem List Items Addressed This Visit      Cardiovascular and Mediastinum   Essential hypertension    - Today, the patient's blood pressure is not well managed on diet and med. - The patient will continue the current treatment regimen.  - I encouraged the patient to eat a low-sodium diet to help control blood pressure. - I encouraged the patient to live an active lifestyle and complete activities that increases heart rate to 85% target heart rate at least 5 times per week for one hour.          CAD (coronary artery disease)    Patient complains of shortness of breath on exertion does not have any angina.  His main problem is exogenous obesity and he is trying to lose weight.        Respiratory   OSA (obstructive sleep apnea)    Patient says that he is using the CPAP machine on a daily basis.      Chronic obstructive pulmonary disease ([place])    - The patient's COPD is stable with treatment.  - I informed the patient of the importance of getting routine flu and pneumonia vaccines.  - I instructed the patient to seek help if they  experience more difficulty breathing than usual or if the pulse oximeter shows low oxygen for longer than 5 minutes.         Other   Need for COVID-19 vaccine - Primary   Relevant Orders   Pfizer SARS-COV-2 Vaccine (Completed)      No orders of the defined types were placed in this encounter.   Follow-up: No follow-ups on file.  Patient was given a prescription for Xanax.   Cletis Athens, MD

## 2020-06-05 NOTE — Assessment & Plan Note (Signed)
-   The patient's COPD is stable with treatment.  - I informed the patient of the importance of getting routine flu and pneumonia vaccines.  - I instructed the patient to seek help if they experience more difficulty breathing than usual or if the pulse oximeter shows low oxygen for longer than 5 minutes.

## 2020-06-05 NOTE — Assessment & Plan Note (Signed)
-   Today, the patient's blood pressure is not well managed on diet and med. - The patient will continue the current treatment regimen.  - I encouraged the patient to eat a low-sodium diet to help control blood pressure. - I encouraged the patient to live an active lifestyle and complete activities that increases heart rate to 85% target heart rate at least 5 times per week for one hour.

## 2020-06-28 ENCOUNTER — Other Ambulatory Visit: Payer: Self-pay | Admitting: *Deleted

## 2020-06-28 ENCOUNTER — Other Ambulatory Visit: Payer: Self-pay | Admitting: Internal Medicine

## 2020-06-28 MED ORDER — PANTOPRAZOLE SODIUM 40 MG PO TBEC: 40.0000 mg | DELAYED_RELEASE_TABLET | Freq: Every day | ORAL | 3 refills | Status: DC

## 2020-07-18 ENCOUNTER — Encounter: Payer: Self-pay | Admitting: Cardiovascular Disease

## 2020-07-18 ENCOUNTER — Encounter: Payer: Self-pay | Admitting: *Deleted

## 2020-07-18 ENCOUNTER — Ambulatory Visit (INDEPENDENT_AMBULATORY_CARE_PROVIDER_SITE_OTHER): Payer: No Typology Code available for payment source | Admitting: Cardiovascular Disease

## 2020-07-18 ENCOUNTER — Other Ambulatory Visit: Payer: Self-pay

## 2020-07-18 VITALS — BP 140/74 | HR 70 | Ht 71.0 in | Wt 278.8 lb

## 2020-07-18 DIAGNOSIS — I10 Essential (primary) hypertension: Secondary | ICD-10-CM | POA: Diagnosis not present

## 2020-07-18 DIAGNOSIS — I739 Peripheral vascular disease, unspecified: Secondary | ICD-10-CM

## 2020-07-18 DIAGNOSIS — I25118 Atherosclerotic heart disease of native coronary artery with other forms of angina pectoris: Secondary | ICD-10-CM | POA: Diagnosis not present

## 2020-07-18 DIAGNOSIS — E7800 Pure hypercholesterolemia, unspecified: Secondary | ICD-10-CM

## 2020-07-18 NOTE — Progress Notes (Signed)
Chief Complaint  Patient presents with  . Follow-up    CAD   History of Present Illness: 65 yo male with history of CAD s/p PCI/stent followed by 4V CABG (LIMA to LAD, SVG to OM1, SVG to Ramus, SVG to PDA) October 2014 at [facility], PAD, HTN, COPD, tobacco abuse, HLD, sleep apnea here today for cardiac follow up. I saw him as a new patient September 2015 to establish cardiology care. He has been seen in our [place] office in April 2012 by Dr. Rockey Situ and had been followed after that by Dr. Saralyn Pilar in [place]. Admitted to [facility] October 2014 with chest pain and underwent 4V CABG. Also has COPD and OSA. He is known to have PAD with occluded right SFA. Lower extremity dopplers in our office 02/01/14 with normal ABI on the left and slight reduction of ABI (0.92) on the right with documentation of occluded right SFA. Admitted to [facility] January 2016 with chest pain. Troponin negative. Stress myoview 05/19/14 with no ischemia. When I saw him in 2018 he c/o right leg pain. ABI was 0.92 on the right and 1.1 on the left. We discussed referral to [facility] but he wished to try more walking first. Echo August 2019 with LVEF=65-70%. No valve disease. Nuclear stress test August 2019 with no ischemia. He had an echo per his primary care doctor in October 2020 with normal LV function and no valve disease. Cardiac monitor per his primary care doctor in October 2020 showed sinus bradycardia and his beta blocker was stopped.   He is here today for follow up. The patient denies any palpitations, lower extremity edema, orthopnea, PND, dizziness, near syncope or syncope. He has been having some exertional dyspnea. He has gained some weight. Some chest pains with exertion, resolve quickly.    Primary Care Physician: Cletis Athens, MD  Past Medical History:  Diagnosis Date  . Anxiety and depression    a. severe - seeing psychiatry.  Marland Kitchen CAD (coronary artery disease)    a. s/p 4V CABG 2014  b. 05/2014 MV: No ischemia, EF 69%.   . Chronic prostatitis   . COPD (chronic obstructive pulmonary disease) ([place])    ongoing tobacco use  . GERD (gastroesophageal reflux disease)   . History of DVT (deep vein thrombosis)   . Hyperlipidemia   . Hyperlipidemia   . Hypertension    a. renal arteries patent on remote cath.  . Obesity   . OSA (obstructive sleep apnea)    severe  . PAD (peripheral artery disease) ([place])    a. 01/2014 ABIs R: 0.92, L 1.05.  Know total occlusion of right SFA with distal reconstitution-->Med Rx.  . Sleep apnea   . Tobacco abuse    a. 05/2014 down to 3-5 cigarettes day and vaping also.    Past Surgical History:  Procedure Laterality Date  . CHOLECYSTECTOMY    . CORONARY ARTERY BYPASS GRAFT  October 2014   4V  . CYSTOSCOPY    . KNEE ARTHROSCOPY     right meniscal tear    Current Outpatient Medications  Medication Sig Dispense Refill  . ALPRAZolam (XANAX) 0.5 MG tablet Take 1 tablet (0.5 mg total) by mouth 2 (two) times daily. 60 tablet 0  . Ascorbic Acid (VITAMIN C PO) Take by mouth.    Marland Kitchen aspirin EC 81 MG tablet Take 81 mg by mouth daily.    Marland Kitchen atorvastatin (LIPITOR) 80 MG tablet Take 1 tablet (80 mg total) by mouth daily. 90 tablet 3  .  B Complex Vitamins (B COMPLEX PO) Take by mouth.    . clopidogrel (PLAVIX) 75 MG tablet Take 1 tablet (75 mg total) by mouth daily. 90 tablet 3  . losartan (COZAAR) 100 MG tablet Take 100 mg by mouth daily.    Marland Kitchen MAGNESIUM PO Take by mouth.    . Menaquinone-7 (VITAMIN K2 PO) Take by mouth.    . Multiple Vitamins-Minerals (ZINC PO) Take by mouth.    . nitroGLYCERIN (NITROSTAT) 0.4 MG SL tablet Place 1 tablet (0.4 mg total) under the tongue every 5 (five) minutes as needed for chest pain. 50 tablet 3  . pantoprazole (PROTONIX) 40 MG tablet Take 1 tablet (40 mg total) by mouth daily. 90 tablet 3  . sertraline (ZOLOFT) 100 MG tablet TAKE 1 1/2 TABLETS BY MOUTH EVERY DAY 45 tablet 0  . VITAMIN D PO Take by mouth.     No current facility-administered  medications for this visit.    Allergies  Allergen Reactions  . Sulfa Antibiotics Diarrhea  . Relafen [Nabumetone] Other (See Comments)    Pt doesn't remember     Social History   Socioeconomic History  . Marital status: Married    Spouse name: Not on file  . Number of children: Not on file  . Years of education: Not on file  . Highest education level: Not on file  Occupational History  . Not on file  Tobacco Use  . Smoking status: Former Smoker    Packs/day: 1.00    Years: 40.00    Pack years: 40.00    Types: Cigarettes    Quit date: 08/11/2014    Years since quitting: 5.9  . Smokeless tobacco: Never Used  Vaping Use  . Vaping Use: Never used  Substance and Sexual Activity  . Alcohol use: No    Alcohol/week: 0.0 standard drinks  . Drug use: No  . Sexual activity: Yes    Birth control/protection: None  Other Topics Concern  . Not on file  Social History Narrative  . Not on file   Social Determinants of Health   Financial Resource Strain: Not on file  Food Insecurity: Not on file  Transportation Needs: Not on file  Physical Activity: Not on file  Stress: Not on file  Social Connections: Not on file  Intimate Partner Violence: Not on file    Family History  Problem Relation Age of Onset  . Hypertension Mother   . Cataracts Mother   . Diabetes Father   . Adrenal disorder Neg Hx     Review of Systems:  As stated in the HPI and otherwise negative.   BP 140/74   Pulse 70   Ht 5\' 11"  (1.803 m)   Wt 278 lb 12.8 oz (126.5 kg)   SpO2 97%   BMI 38.88 kg/m   Physical Examination: General: Well developed, well nourished, NAD  HEENT: OP clear, mucus membranes moist  SKIN: warm, dry. No rashes. Neuro: No focal deficits  Musculoskeletal: Muscle strength 5/5 all ext  Psychiatric: Mood and affect normal  Neck: No JVD, no carotid bruits, no thyromegaly, no lymphadenopathy.  Lungs:Clear bilaterally, no wheezes, rhonci, crackles Cardiovascular: Regular rate  and rhythm. No murmurs, gallops or rubs. Abdomen:Soft. Bowel sounds present. Non-tender.  Extremities: No lower extremity edema. Pulses are 2 + in the bilateral DP/PT.  EKG:  EKG is not ordered today. The ekg ordered today demonstrates   Echo August 2019:  - Left ventricle: The cavity size was normal. Systolic function was  vigorous. The estimated ejection fraction was in the range of 65%   to 70%. Wall motion was normal; there were no regional wall   motion abnormalities. - Aortic valve: Trileaflet; normal thickness, mildly calcified   leaflets. - Right ventricle: The cavity size was mildly dilated. Wall   thickness was normal.  Recent Labs: 11/30/2019: ALT 31; BUN 16; Creat 1.06; Hemoglobin 16.2; Platelets 217; Potassium 5.4; Sodium 142; TSH 1.90   Lipid Panel    Component Value Date/Time   CHOL 205 (H) 11/30/2019 0849   CHOL 100 03/16/2014 1325   TRIG 148 11/30/2019 0849   TRIG 114 03/16/2014 1325   HDL 44 11/30/2019 0849   HDL 31 (L) 03/16/2014 1325   CHOLHDL 4.7 11/30/2019 0849   VLDL 13 10/26/2015 0744   VLDL 23 03/16/2014 1325   LDLCALC 134 (H) 11/30/2019 0849   LDLCALC 46 03/16/2014 1325   LDLDIRECT 122.5 12/12/2008 0840     Wt Readings from Last 3 Encounters:  07/18/20 278 lb 12.8 oz (126.5 kg)  06/05/20 274 lb 8 oz (124.5 kg)  03/27/20 270 lb 14.4 oz (122.9 kg)     Other studies Reviewed: Additional studies/ records that were reviewed today include: . Review of the above records demonstrates:    Assessment and Plan:   1. CAD with stable angina: He has rare mild chest pains. No change. LV systolic function normal by echo in 2020. Stress test with no ischemia in 2019. Will continue ASA, Plavix and statin. Beta blocker stopped due to bradycardia.  Will arrange Lexiscan nuclear stress test given recent chest pains.   2. HTN: BP is well controlled. No changes  3. HLD: Lipids are followed in primary care. Last LDL 134 in July 2021. He will have repeat testing  soon. Goal LDL 70. If he is not at goal LDL, will need to consider Repatha or Praluent. Continue statin.    4. Tobacco abuse, in remission: He has stopped smoking.   5. PAD: Known to have occluded right SFA. ABI slightly decreased on right in 2018. He did not wish to pursue referral to the [facility] at that time. He will call with change in symptoms.   Current medicines are reviewed at length with the patient today.  The patient does not have concerns regarding medicines.  The following changes have been made:  no change  Labs/ tests ordered today include:   Orders Placed This Encounter  Procedures  . MYOCARDIAL PERFUSION IMAGING    Disposition:   FU with me in 12 months  Signed, Lauree Chandler, MD 07/18/2020 10:41 AM    [facility] [place], [place],   [address] Phone: [phone]; Fax: [phone]

## 2020-07-18 NOTE — Patient Instructions (Signed)
Medication Instructions:  No changes *If you need a refill on your cardiac medications before your next appointment, please call your pharmacy*   Lab Work: none If you have labs (blood work) drawn today and your tests are completely normal, you will receive your results only by: MyChart Message (if you have MyChart) OR A paper copy in the mail If you have any lab test that is abnormal or we need to change your treatment, we will call you to review the results.   Testing/Procedures: Your physician has requested that you have a lexiscan myoview. For further information please visit [email or web address]. Please follow instruction sheet, as given.   Follow-Up: At [facility], you and your health needs are our priority.  As part of our continuing mission to provide you with exceptional heart care, we have created designated Provider Care Teams.  These Care Teams include your primary Cardiologist (physician) and Advanced Practice Providers (APPs -  Physician Assistants and Nurse Practitioners) who all work together to provide you with the care you need, when you need it.   Your next appointment:   12 month(s)  The format for your next appointment:   In Person  Provider:   You may see Christopher McAlhany, MD or one of the following Advanced Practice Providers on your designated Care Team:   Dayna Dunn, PA-C Michele Lenze, PA-C   Other Instructions   

## 2020-07-24 ENCOUNTER — Other Ambulatory Visit: Payer: Self-pay | Admitting: Internal Medicine

## 2020-07-30 ENCOUNTER — Encounter (HOSPITAL_COMMUNITY): Payer: Self-pay | Admitting: *Deleted

## 2020-07-30 ENCOUNTER — Telehealth (HOSPITAL_COMMUNITY): Payer: Self-pay

## 2020-07-30 NOTE — Telephone Encounter (Signed)
Detailed instructions left on the patient's answering machine. Asked to call back with any questions. S.Williams EMTP 

## 2020-07-31 ENCOUNTER — Other Ambulatory Visit: Payer: Self-pay

## 2020-07-31 ENCOUNTER — Ambulatory Visit (HOSPITAL_COMMUNITY): Payer: No Typology Code available for payment source | Attending: Cardiovascular Disease

## 2020-07-31 DIAGNOSIS — I25118 Atherosclerotic heart disease of native coronary artery with other forms of angina pectoris: Secondary | ICD-10-CM | POA: Insufficient documentation

## 2020-07-31 DIAGNOSIS — E7800 Pure hypercholesterolemia, unspecified: Secondary | ICD-10-CM | POA: Insufficient documentation

## 2020-07-31 DIAGNOSIS — I10 Essential (primary) hypertension: Secondary | ICD-10-CM | POA: Insufficient documentation

## 2020-07-31 DIAGNOSIS — I739 Peripheral vascular disease, unspecified: Secondary | ICD-10-CM | POA: Diagnosis not present

## 2020-07-31 MED ORDER — REGADENOSON 0.4 MG/5ML IV SOLN
0.4000 mg | Freq: Once | INTRAVENOUS | Status: AC
  Administered 2020-07-31: 0.4 mg via INTRAVENOUS

## 2020-07-31 MED ORDER — TECHNETIUM TC 99M TETROFOSMIN IV KIT
32.9000 | PACK | Freq: Once | INTRAVENOUS | Status: AC | PRN
  Administered 2020-07-31: 32.9 via INTRAVENOUS
  Filled 2020-07-31: qty 33

## 2020-08-01 ENCOUNTER — Ambulatory Visit (HOSPITAL_COMMUNITY): Payer: No Typology Code available for payment source | Attending: Cardiovascular Disease

## 2020-08-01 LAB — MYOCARDIAL PERFUSION IMAGING
LV dias vol: 93 mL (ref 62–150)
LV sys vol: 32 mL
Peak HR: 78 {beats}/min
Rest HR: 62 {beats}/min
SDS: 2
SRS: 0
SSS: 2
TID: 1.24

## 2020-08-01 MED ORDER — TECHNETIUM TC 99M TETROFOSMIN IV KIT
31.4000 | PACK | Freq: Once | INTRAVENOUS | Status: AC | PRN
  Administered 2020-08-01: 31.4 via INTRAVENOUS
  Filled 2020-08-01: qty 32

## 2020-08-09 ENCOUNTER — Encounter: Payer: Self-pay | Admitting: Family Medicine

## 2020-08-09 ENCOUNTER — Other Ambulatory Visit: Payer: Self-pay

## 2020-08-09 ENCOUNTER — Ambulatory Visit (INDEPENDENT_AMBULATORY_CARE_PROVIDER_SITE_OTHER): Payer: No Typology Code available for payment source | Admitting: Family Medicine

## 2020-08-09 VITALS — BP 136/68 | HR 65 | Ht 71.0 in | Wt 270.4 lb

## 2020-08-09 DIAGNOSIS — I10 Essential (primary) hypertension: Secondary | ICD-10-CM | POA: Diagnosis not present

## 2020-08-09 DIAGNOSIS — F411 Generalized anxiety disorder: Secondary | ICD-10-CM | POA: Diagnosis not present

## 2020-08-09 DIAGNOSIS — Z951 Presence of aortocoronary bypass graft: Secondary | ICD-10-CM

## 2020-08-09 MED ORDER — ALPRAZOLAM 0.5 MG PO TABS: 0.5000 mg | ORAL_TABLET | Freq: Two times a day (BID) | ORAL | 0 refills | Status: DC

## 2020-08-09 NOTE — Assessment & Plan Note (Signed)
Had recent stress test with his cardiologist which was wnl.

## 2020-08-09 NOTE — Assessment & Plan Note (Addendum)
Patient doing well on Sertraline and Xanax PRN BID. He does have at least one episode of Anxiety per day where he needs the PRN Xanax, says he has stressors at work and family stress. Denies HI/SI.   Plan- Continue current Medication, refil Xanax today.

## 2020-08-09 NOTE — Progress Notes (Signed)
Established Patient Office Visit  SUBJECTIVE:  Subjective  Patient ID: Richard Burton, male    DOB: 03/29/1956  Age: 65 y.o. MRN: [id number]  CC:  Chief Complaint  Patient presents with  . Anxiety    Patient needs refill of xanax     HPI Richard Burton is a 64 y.o. male presenting today for     Past Medical History:  Diagnosis Date  . Anxiety and depression    a. severe - seeing psychiatry.  [place] Kitchen CAD (coronary artery disease)    a. s/p 4V CABG 2014  b. 05/2014 MV: No ischemia, EF 69%.  . Chronic prostatitis   . COPD (chronic obstructive pulmonary disease) ([place])    ongoing tobacco use  . GERD (gastroesophageal reflux disease)   . History of DVT (deep vein thrombosis)   . Hyperlipidemia   . Hyperlipidemia   . Hypertension    a. renal arteries patent on remote cath.  . Obesity   . OSA (obstructive sleep apnea)    severe  . PAD (peripheral artery disease) ([place])    a. 01/2014 ABIs R: 0.92, L 1.05.  Know total occlusion of right SFA with distal reconstitution-->Med Rx.  . Sleep apnea   . Tobacco abuse    a. 05/2014 down to 3-5 cigarettes day and vaping also.    Past Surgical History:  Procedure Laterality Date  . CHOLECYSTECTOMY    . CORONARY ARTERY BYPASS GRAFT  October 2014   4V  . CYSTOSCOPY    . KNEE ARTHROSCOPY     right meniscal tear    Family History  Problem Relation Age of Onset  . Hypertension Mother   . Cataracts Mother   . Diabetes Father   . Adrenal disorder Neg Hx     Social History   Socioeconomic History  . Marital status: Married    Spouse name: Not on file  . Number of children: Not on file  . Years of education: Not on file  . Highest education level: Not on file  Occupational History  . Not on file  Tobacco Use  . Smoking status: Former Smoker    Packs/day: 1.00    Years: 40.00    Pack years: 40.00    Types: Cigarettes    Quit date: 08/11/2014    Years since quitting: 6.0  . Smokeless tobacco: Never Used  Vaping Use  .  Vaping Use: Never used  Substance and Sexual Activity  . Alcohol use: No    Alcohol/week: 0.0 standard drinks  . Drug use: No  . Sexual activity: Yes    Birth control/protection: None  Other Topics Concern  . Not on file  Social History Narrative  . Not on file   Social Determinants of Health   Financial Resource Strain: Not on file  Food Insecurity: Not on file  Transportation Needs: Not on file  Physical Activity: Not on file  Stress: Not on file  Social Connections: Not on file  Intimate Partner Violence: Not on file     Current Outpatient Medications:  .  ALPRAZolam (XANAX) 0.5 MG tablet, Take 1 tablet (0.5 mg total) by mouth 2 (two) times daily., Disp: 60 tablet, Rfl: 0 .  Ascorbic Acid (VITAMIN C PO), Take by mouth., Disp: , Rfl:  .  aspirin EC 81 MG tablet, Take 81 mg by mouth daily., Disp: , Rfl:  .  atorvastatin (LIPITOR) 80 MG tablet, Take 1 tablet (80 mg total) by mouth daily., Disp: 90 tablet,  Rfl: 3 .  B Complex Vitamins (B COMPLEX PO), Take by mouth., Disp: , Rfl:  .  clopidogrel (PLAVIX) 75 MG tablet, Take 1 tablet (75 mg total) by mouth daily., Disp: 90 tablet, Rfl: 3 .  losartan (COZAAR) 100 MG tablet, Take 100 mg by mouth daily., Disp: , Rfl:  .  MAGNESIUM PO, Take by mouth., Disp: , Rfl:  .  Menaquinone-7 (VITAMIN K2 PO), Take by mouth., Disp: , Rfl:  .  Multiple Vitamins-Minerals (ZINC PO), Take by mouth., Disp: , Rfl:  .  nitroGLYCERIN (NITROSTAT) 0.4 MG SL tablet, Place 1 tablet (0.4 mg total) under the tongue every 5 (five) minutes as needed for chest pain., Disp: 50 tablet, Rfl: 3 .  pantoprazole (PROTONIX) 40 MG tablet, Take 1 tablet (40 mg total) by mouth daily., Disp: 90 tablet, Rfl: 3 .  sertraline (ZOLOFT) 100 MG tablet, TAKE 1 & 1/2 (ONE & ONE-HALF) TABLETS BY MOUTH ONCE DAILY, Disp: 180 tablet, Rfl: 0 .  VITAMIN D PO, Take by mouth., Disp: , Rfl:    Allergies  Allergen Reactions  . Sulfa Antibiotics Diarrhea  . Relafen [Nabumetone] Other (See  Comments)    Pt doesn't remember     ROS Review of Systems  Constitutional: Negative.   HENT: Negative.   Cardiovascular: Negative.   Genitourinary: Negative.   Psychiatric/Behavioral: The patient is nervous/anxious.      OBJECTIVE:    Physical Exam Vitals and nursing note reviewed.  Constitutional:      Appearance: He is obese.  HENT:     Head: Normocephalic.  Eyes:     Pupils: Pupils are equal, round, and reactive to light.  Musculoskeletal:     Cervical back: Normal range of motion.  Psychiatric:        Mood and Affect: Mood normal.     BP 136/68   Pulse 65   Ht 5\' 11"  (1.803 m)   Wt 270 lb 6.4 oz (122.7 kg)   BMI 37.71 kg/m  Wt Readings from Last 3 Encounters:  08/09/20 270 lb 6.4 oz (122.7 kg)  07/31/20 278 lb (126.1 kg)  07/18/20 278 lb 12.8 oz (126.5 kg)    Health Maintenance Due  Topic Date Due  . Hepatitis C Screening  Never done  . HIV Screening  Never done  . COLONOSCOPY (Pts 45-2yrs Insurance coverage will need to be confirmed)  Never done  . TETANUS/TDAP  12/19/2014  . INFLUENZA VACCINE  12/11/2019    There are no preventive care reminders to display for this patient.  CBC Latest Ref Rng & Units 11/30/2019 05/19/2014 05/19/2014  WBC 3.8 - 10.8 Thousand/uL 7.0 7.3 8.2  Hemoglobin 13.2 - 17.1 g/dL 16.2 15.9 16.7  Hematocrit 38.5 - 50.0 % 47.6 45.2 48.6  Platelets 140 - 400 Thousand/uL 217 188 206   CMP Latest Ref Rng & Units 11/30/2019 10/26/2015 05/19/2014  Glucose 65 - 99 mg/dL 135(H) - -  BUN 7 - 25 mg/dL 16 - -  Creatinine 0.70 - 1.25 mg/dL 1.06 - 1.07  Sodium 135 - 146 mmol/L 142 - -  Potassium 3.5 - 5.3 mmol/L 5.4(H) - -  Chloride 98 - 110 mmol/L 104 - -  CO2 20 - 32 mmol/L 21 - -  Calcium 8.6 - 10.3 mg/dL 9.7 - -  Total Protein 6.1 - 8.1 g/dL 7.3 7.1 -  Total Bilirubin 0.2 - 1.2 mg/dL 0.4 0.6 -  Alkaline Phos 40 - 115 U/L - 69 -  AST 10 - 35  U/L 33 19 -  ALT 9 - 46 U/L 31 16 -    Lab Results  Component Value Date   TSH 1.90  11/30/2019   Lab Results  Component Value Date   ALBUMIN 4.3 10/26/2015   ANIONGAP 14 05/19/2014   Lab Results  Component Value Date   CHOL 205 (H) 11/30/2019   CHOL 153 10/26/2015   CHOL 100 03/16/2014   HDL 44 11/30/2019   HDL 51 10/26/2015   HDL 31 (L) 03/16/2014   LDLCALC 134 (H) 11/30/2019   LDLCALC 89 10/26/2015   LDLCALC 46 03/16/2014   CHOLHDL 4.7 11/30/2019   CHOLHDL 3.0 10/26/2015   CHOLHDL 6 12/12/2008   Lab Results  Component Value Date   TRIG 148 11/30/2019   Lab Results  Component Value Date   HGBA1C 5.2 02/04/2006      ASSESSMENT & PLAN:   Problem List Items Addressed This Visit      Cardiovascular and Mediastinum   Hypertension    HTN controlled with current meds, Continue therapy.         Other   S/P CABG x 4    Had recent stress test with his cardiologist which was wnl.       GAD (generalized anxiety disorder) - Primary    Patient doing well on Sertraline and Xanax PRN BID. He does have at least one episode of Anxiety per day where he needs the PRN Xanax, says he has stressors at work and family stress. Denies HI/SI.   Plan- Continue current Medication, refil Xanax today.          No orders of the defined types were placed in this encounter.     Follow-up: No follow-ups on file.    Beckie Salts, [place] [address] [place], [place] [address]

## 2020-08-09 NOTE — Assessment & Plan Note (Signed)
HTN controlled with current meds, Continue therapy.

## 2020-08-30 ENCOUNTER — Other Ambulatory Visit: Payer: Self-pay | Admitting: *Deleted

## 2020-08-30 MED ORDER — LOSARTAN POTASSIUM 100 MG PO TABS: 100.0000 mg | ORAL_TABLET | Freq: Every day | ORAL | 3 refills | Status: DC

## 2020-09-27 ENCOUNTER — Other Ambulatory Visit: Payer: Self-pay | Admitting: *Deleted

## 2020-09-27 MED ORDER — PANTOPRAZOLE SODIUM 40 MG PO TBEC: 40.0000 mg | DELAYED_RELEASE_TABLET | Freq: Every day | ORAL | 3 refills | Status: DC

## 2020-10-12 ENCOUNTER — Other Ambulatory Visit: Payer: Self-pay | Admitting: Family Medicine

## 2020-12-11 ENCOUNTER — Ambulatory Visit (INDEPENDENT_AMBULATORY_CARE_PROVIDER_SITE_OTHER): Payer: No Typology Code available for payment source | Admitting: Internal Medicine

## 2020-12-11 ENCOUNTER — Other Ambulatory Visit: Payer: Self-pay

## 2020-12-11 DIAGNOSIS — E7800 Pure hypercholesterolemia, unspecified: Secondary | ICD-10-CM

## 2020-12-11 DIAGNOSIS — I10 Essential (primary) hypertension: Secondary | ICD-10-CM

## 2020-12-11 DIAGNOSIS — R7309 Other abnormal glucose: Secondary | ICD-10-CM

## 2020-12-11 DIAGNOSIS — N419 Inflammatory disease of prostate, unspecified: Secondary | ICD-10-CM

## 2020-12-12 ENCOUNTER — Encounter: Payer: Self-pay | Admitting: Internal Medicine

## 2020-12-12 ENCOUNTER — Ambulatory Visit (INDEPENDENT_AMBULATORY_CARE_PROVIDER_SITE_OTHER): Payer: No Typology Code available for payment source | Admitting: Internal Medicine

## 2020-12-12 VITALS — BP 144/84 | HR 82 | Ht 71.0 in | Wt 279.9 lb

## 2020-12-12 DIAGNOSIS — I739 Peripheral vascular disease, unspecified: Secondary | ICD-10-CM

## 2020-12-12 DIAGNOSIS — I825Y1 Chronic embolism and thrombosis of unspecified deep veins of right proximal lower extremity: Secondary | ICD-10-CM

## 2020-12-12 DIAGNOSIS — F411 Generalized anxiety disorder: Secondary | ICD-10-CM

## 2020-12-12 DIAGNOSIS — M5441 Lumbago with sciatica, right side: Secondary | ICD-10-CM

## 2020-12-12 DIAGNOSIS — G309 Alzheimer's disease, unspecified: Secondary | ICD-10-CM

## 2020-12-12 DIAGNOSIS — F0280 Dementia in other diseases classified elsewhere without behavioral disturbance: Secondary | ICD-10-CM

## 2020-12-12 DIAGNOSIS — M5442 Lumbago with sciatica, left side: Secondary | ICD-10-CM

## 2020-12-12 DIAGNOSIS — Z0000 Encounter for general adult medical examination without abnormal findings: Secondary | ICD-10-CM | POA: Diagnosis not present

## 2020-12-12 DIAGNOSIS — I10 Essential (primary) hypertension: Secondary | ICD-10-CM

## 2020-12-12 DIAGNOSIS — R7309 Other abnormal glucose: Secondary | ICD-10-CM

## 2020-12-12 DIAGNOSIS — M75102 Unspecified rotator cuff tear or rupture of left shoulder, not specified as traumatic: Secondary | ICD-10-CM

## 2020-12-12 DIAGNOSIS — G4733 Obstructive sleep apnea (adult) (pediatric): Secondary | ICD-10-CM

## 2020-12-12 DIAGNOSIS — F0150 Vascular dementia without behavioral disturbance: Secondary | ICD-10-CM

## 2020-12-12 DIAGNOSIS — M12812 Other specific arthropathies, not elsewhere classified, left shoulder: Secondary | ICD-10-CM

## 2020-12-12 DIAGNOSIS — N3010 Interstitial cystitis (chronic) without hematuria: Secondary | ICD-10-CM

## 2020-12-12 DIAGNOSIS — K219 Gastro-esophageal reflux disease without esophagitis: Secondary | ICD-10-CM

## 2020-12-12 LAB — COMPLETE METABOLIC PANEL WITH GFR
AG Ratio: 1.4 (calc) (ref 1.0–2.5)
ALT: 34 U/L (ref 9–46)
AST: 28 U/L (ref 10–35)
Albumin: 4.2 g/dL (ref 3.6–5.1)
Alkaline phosphatase (APISO): 72 U/L (ref 35–144)
BUN: 14 mg/dL (ref 7–25)
CO2: 26 mmol/L (ref 20–32)
Calcium: 9.6 mg/dL (ref 8.6–10.3)
Chloride: 105 mmol/L (ref 98–110)
Creat: 1.03 mg/dL (ref 0.70–1.35)
Globulin: 3 g/dL (calc) (ref 1.9–3.7)
Glucose, Bld: 130 mg/dL — ABNORMAL HIGH (ref 65–99)
Potassium: 5.3 mmol/L (ref 3.5–5.3)
Sodium: 142 mmol/L (ref 135–146)
Total Bilirubin: 0.4 mg/dL (ref 0.2–1.2)
Total Protein: 7.2 g/dL (ref 6.1–8.1)
eGFR: 82 mL/min/{1.73_m2} (ref >=60)

## 2020-12-12 LAB — CBC WITH DIFFERENTIAL/PLATELET
Absolute Monocytes: 651 cells/uL (ref 200–950)
Basophils Absolute: 37 cells/uL (ref 0–200)
Basophils Relative: 0.5 %
Eosinophils Absolute: 192 cells/uL (ref 15–500)
Eosinophils Relative: 2.6 %
HCT: 47.3 % (ref 38.5–50.0)
Hemoglobin: 15.7 g/dL (ref 13.2–17.1)
Lymphs Abs: 2035 cells/uL (ref 850–3900)
MCH: 29.7 pg (ref 27.0–33.0)
MCHC: 33.2 g/dL (ref 32.0–36.0)
MCV: 89.4 fL (ref 80.0–100.0)
MPV: 10.3 fL (ref 7.5–12.5)
Monocytes Relative: 8.8 %
Neutro Abs: 4484 cells/uL (ref 1500–7800)
Neutrophils Relative %: 60.6 %
Platelets: 242 10*3/uL (ref 140–400)
RBC: 5.29 10*6/uL (ref 4.20–5.80)
RDW: 12.9 % (ref 11.0–15.0)
Total Lymphocyte: 27.5 %
WBC: 7.4 10*3/uL (ref 3.8–10.8)

## 2020-12-12 LAB — LIPID PANEL
Cholesterol: 224 mg/dL — ABNORMAL HIGH (ref <200)
HDL: 44 mg/dL (ref >=40)
LDL Cholesterol (Calc): 148 mg/dL (calc) — ABNORMAL HIGH
Non-HDL Cholesterol (Calc): 180 mg/dL (calc) — ABNORMAL HIGH (ref <130)
Total CHOL/HDL Ratio: 5.1 (calc) — ABNORMAL HIGH (ref <5.0)
Triglycerides: 187 mg/dL — ABNORMAL HIGH (ref <150)

## 2020-12-12 LAB — PSA: PSA: 0.52 ng/mL (ref <=4.00)

## 2020-12-12 LAB — HEMOGLOBIN A1C
Hgb A1c MFr Bld: 6.5 % of total Hgb — ABNORMAL HIGH (ref <5.7)
Mean Plasma Glucose: 140 mg/dL
eAG (mmol/L): 7.7 mmol/L

## 2020-12-12 LAB — TSH: TSH: 2.32 mIU/L (ref 0.40–4.50)

## 2020-12-12 MED ORDER — ALPRAZOLAM 0.5 MG PO TABS: 0.5000 mg | ORAL_TABLET | Freq: Two times a day (BID) | ORAL | 0 refills | Status: DC

## 2020-12-12 NOTE — Assessment & Plan Note (Signed)
-   Patient experiencing high levels of anxiety.  - Encouraged patient to engage in relaxing activities like yoga, meditation, journaling, going for a walk, or participating in a hobby.  - Encouraged patient to reach out to trusted friends or family members about recent struggles, Patient was advised to read A book, how to stop worrying and start living, it is good book to read to control  the stress  

## 2020-12-12 NOTE — Assessment & Plan Note (Signed)
Patient was referred to orthopedic surgeon if he continues to have problems with hand

## 2020-12-12 NOTE — Assessment & Plan Note (Signed)

## 2020-12-12 NOTE — Progress Notes (Signed)
Established Patient Office Visit  Subjective:  Patient ID: Richard Burton, male    DOB: 06/02/1955  Age: 65 y.o. MRN: [id number]  CC:  Chief Complaint  Patient presents with   Annual Exam    HPI  Richard Burton presents for general check  Past Medical History:  Diagnosis Date   Anxiety and depression    a. severe - seeing psychiatry.   CAD (coronary artery disease)    a. s/p 4V CABG 2014  b. 05/2014 MV: No ischemia, EF 69%.   Chronic prostatitis    COPD (chronic obstructive pulmonary disease) ([facility])    ongoing tobacco use   GERD (gastroesophageal reflux disease)    History of DVT (deep vein thrombosis)    Hyperlipidemia    Hyperlipidemia    Hypertension    a. renal arteries patent on remote cath.   Obesity    OSA (obstructive sleep apnea)    severe   PAD (peripheral artery disease) ([place])    a. 01/2014 ABIs R: 0.92, L 1.05.  Know total occlusion of right SFA with distal reconstitution-->Med Rx.   Sleep apnea    Tobacco abuse    a. 05/2014 down to 3-5 cigarettes day and vaping also.    Past Surgical History:  Procedure Laterality Date   CHOLECYSTECTOMY     CORONARY ARTERY BYPASS GRAFT  October 2014   4V   CYSTOSCOPY     KNEE ARTHROSCOPY     right meniscal tear    Family History  Problem Relation Age of Onset   Hypertension Mother    Cataracts Mother    Diabetes Father    Adrenal disorder Neg Hx     Social History   Socioeconomic History   Marital status: Married    Spouse name: Not on file   Number of children: Not on file   Years of education: Not on file   Highest education level: Not on file  Occupational History   Not on file  Tobacco Use   Smoking status: Former    Packs/day: 1.00    Years: 40.00    Pack years: 40.00    Types: Cigarettes    Quit date: 08/11/2014    Years since quitting: 6.3   Smokeless tobacco: Never  Vaping Use   Vaping Use: Never used  Substance and Sexual Activity   Alcohol use: No    Alcohol/week: 0.0 standard  drinks   Drug use: No   Sexual activity: Yes    Birth control/protection: None  Other Topics Concern   Not on file  Social History Narrative   Not on file   Social Determinants of Health   Financial Resource Strain: Not on file  Food Insecurity: Not on file  Transportation Needs: Not on file  Physical Activity: Not on file  Stress: Not on file  Social Connections: Not on file  Intimate Partner Violence: Not on file     Current Outpatient Medications:    Ascorbic Acid (VITAMIN C PO), Take by mouth., Disp: , Rfl:    aspirin EC 81 MG tablet, Take 81 mg by mouth daily., Disp: , Rfl:    atorvastatin (LIPITOR) 80 MG tablet, Take 1 tablet (80 mg total) by mouth daily., Disp: 90 tablet, Rfl: 3   B Complex Vitamins (B COMPLEX PO), Take by mouth., Disp: , Rfl:    clopidogrel (PLAVIX) 75 MG tablet, Take 1 tablet (75 mg total) by mouth daily., Disp: 90 tablet, Rfl: 3   losartan (COZAAR)  100 MG tablet, Take 1 tablet (100 mg total) by mouth daily., Disp: 90 tablet, Rfl: 3   MAGNESIUM PO, Take by mouth., Disp: , Rfl:    Menaquinone-7 (VITAMIN K2 PO), Take by mouth., Disp: , Rfl:    Multiple Vitamins-Minerals (ZINC PO), Take by mouth., Disp: , Rfl:    nitroGLYCERIN (NITROSTAT) 0.4 MG SL tablet, Place 1 tablet (0.4 mg total) under the tongue every 5 (five) minutes as needed for chest pain., Disp: 50 tablet, Rfl: 3   pantoprazole (PROTONIX) 40 MG tablet, Take 1 tablet (40 mg total) by mouth daily., Disp: 90 tablet, Rfl: 3   sertraline (ZOLOFT) 100 MG tablet, TAKE 1 & 1/2 (ONE & ONE-HALF) TABLETS BY MOUTH ONCE DAILY, Disp: 180 tablet, Rfl: 0   VITAMIN D PO, Take by mouth., Disp: , Rfl:    ALPRAZolam (XANAX) 0.5 MG tablet, Take 1 tablet (0.5 mg total) by mouth 2 (two) times daily., Disp: 60 tablet, Rfl: 0   Allergies  Allergen Reactions   Sulfa Antibiotics Diarrhea   Relafen [Nabumetone] Other (See Comments)    Pt doesn't remember     ROS Review of Systems  Constitutional: Negative.    HENT: Negative.    Eyes: Negative.   Respiratory: Negative.    Cardiovascular: Negative.   Gastrointestinal: Negative.   Endocrine: Negative.   Genitourinary: Negative.   Musculoskeletal: Negative.   Skin: Negative.   Allergic/Immunologic: Negative.   Neurological: Negative.   Hematological: Negative.   Psychiatric/Behavioral: Negative.    All other systems reviewed and are negative.    Objective:    Physical Exam Vitals reviewed.  Constitutional:      Appearance: Normal appearance.  HENT:     Mouth/Throat:     Mouth: Mucous membranes are moist.  Eyes:     Pupils: Pupils are equal, round, and reactive to light.  Neck:     Vascular: No carotid bruit.  Cardiovascular:     Rate and Rhythm: Normal rate and regular rhythm.     Pulses: Normal pulses.     Heart sounds: Normal heart sounds.  Pulmonary:     Effort: Pulmonary effort is normal.     Breath sounds: Normal breath sounds.  Abdominal:     General: Bowel sounds are normal.     Palpations: Abdomen is soft. There is no hepatomegaly, splenomegaly or mass.     Tenderness: There is no abdominal tenderness.     Hernia: No hernia is present.  Musculoskeletal:     Cervical back: Neck supple.     Right lower leg: No edema.     Left lower leg: No edema.  Skin:    Findings: No rash.  Neurological:     Mental Status: He is alert and oriented to person, place, and time.     Motor: No weakness.  Psychiatric:        Mood and Affect: Mood normal.        Behavior: Behavior normal.    BP (!) 144/84   Pulse 82   Ht 5\' 11"  (1.803 m)   Wt 279 lb 14.4 oz (127 kg)   BMI 39.04 kg/m  Wt Readings from Last 3 Encounters:  12/12/20 279 lb 14.4 oz (127 kg)  08/09/20 270 lb 6.4 oz (122.7 kg)  07/31/20 278 lb (126.1 kg)     Health Maintenance Due  Topic Date Due   Pneumococcal Vaccine 50-12 Years old (1 - PCV) Never done   HIV Screening  Never done  Hepatitis C Screening  Never done   COLONOSCOPY (Pts 45-18yrs Insurance  coverage will need to be confirmed)  Never done   Zoster Vaccines- Shingrix (1 of 2) Never done   TETANUS/TDAP  12/19/2014   COVID-19 Vaccine (3 - Booster for Pfizer series) 11/03/2020   INFLUENZA VACCINE  12/10/2020    There are no preventive care reminders to display for this patient.  Lab Results  Component Value Date   TSH 2.32 12/11/2020   Lab Results  Component Value Date   WBC 7.4 12/11/2020   HGB 15.7 12/11/2020   HCT 47.3 12/11/2020   MCV 89.4 12/11/2020   PLT 242 12/11/2020   Lab Results  Component Value Date   NA 142 12/11/2020   K 5.3 12/11/2020   CO2 26 12/11/2020   GLUCOSE 130 (H) 12/11/2020   BUN 14 12/11/2020   CREATININE 1.03 12/11/2020   BILITOT 0.4 12/11/2020   ALKPHOS 69 10/26/2015   AST 28 12/11/2020   ALT 34 12/11/2020   PROT 7.2 12/11/2020   ALBUMIN 4.3 10/26/2015   CALCIUM 9.6 12/11/2020   ANIONGAP 14 05/19/2014   EGFR 82 12/11/2020   Lab Results  Component Value Date   CHOL 224 (H) 12/11/2020   Lab Results  Component Value Date   HDL 44 12/11/2020   Lab Results  Component Value Date   LDLCALC 148 (H) 12/11/2020   Lab Results  Component Value Date   TRIG 187 (H) 12/11/2020   Lab Results  Component Value Date   CHOLHDL 5.1 (H) 12/11/2020   Lab Results  Component Value Date   HGBA1C 6.5 (H) 12/11/2020      Assessment & Plan:   Problem List Items Addressed This Visit       Cardiovascular and Mediastinum   Essential hypertension - Primary     Patient denies any chest pain or shortness of breath there is no history of palpitation or paroxysmal nocturnal dyspnea   patient was advised to follow low-salt low-cholesterol diet    ideally I want to keep systolic blood pressure below 130 mmHg, patient was asked to check blood pressure one times a week and give me a report on that.  Patient will be follow-up in 3 months  or earlier as needed, patient will call me back for any change in the cardiovascular symptoms Patient was  advised to buy a book from local bookstore concerning blood pressure and read several chapters  every day.  This will be supplemented by some of the material we will give him from the office.  Patient should also utilize other resources like YouTube and Internet to learn more about the blood pressure and the diet.       PAD (peripheral artery disease) ([facility])    Complains of claudication walking a block       DVT (deep venous thrombosis) ([facility])    No recurrence.  Patient has claudication when walking       Mixed Alzheimer's and vascular dementia [facility])    Patient was advised to continue taking his medication and try to lose weight.       Relevant Medications   ALPRAZolam (XANAX) 0.5 MG tablet     Respiratory   OSA (obstructive sleep apnea)     Digestive   Gastroesophageal reflux disease without esophagitis    - The patient's GERD is stable on medication.  - Instructed the patient to avoid eating spicy and acidic foods, as well as foods high in fat. - Instructed  the patient to avoid eating large meals or meals 2-3 hours prior to sleeping.         Nervous and Auditory   Acute bilateral low back pain with bilateral sciatica   Relevant Medications   ALPRAZolam (XANAX) 0.5 MG tablet     Musculoskeletal and Integument   Left rotator cuff tear arthropathy    Stable         Genitourinary   CHRONIC INTERSTITIAL CYSTITIS    Stable         Other   HYPERGLYCEMIA    - The patient's blood sugar is labile on med. - The patient will continue the current treatment regimen.  - I encouraged the patient to regularly check blood sugar.  - I encouraged the patient to monitor diet. I encouraged the patient to eat low-carb and low-sugar to help prevent blood sugar spikes.  - I encouraged the patient to continue following their prescribed treatment plan for diabetes - I informed the patient to get help if blood sugar drops below $RemoveBef'54mg'BCuWwCljSm$ /dL, or if suddenly have trouble thinking clearly or  breathing.  Patient was advised to buy a book on diabetes from a local bookstore or from [place]  Patient should read 2 chapters every day to keep the motivation going, this is in addition to some of the materials we provided them from the office.  There are other resources on the Internet like YouTube and wilkipedia to get an education on the diabetes       Annual physical exam    Patient is known to have atherosclerosis and he was advised to take statin medications.  He was advised to walk on a daily basis.  Patient does not smoke does not drink.       GAD (generalized anxiety disorder)    - Patient experiencing high levels of anxiety.  - Encouraged patient to engage in relaxing activities like yoga, meditation, journaling, going for a walk, or participating in a hobby.  - Encouraged patient to reach out to trusted friends or family members about recent struggles, Patient was advised to read A book, how to stop worrying and start living, it is good book to read to control  the stress        Relevant Medications   ALPRAZolam (XANAX) 0.5 MG tablet    Meds ordered this encounter  Medications   ALPRAZolam (XANAX) 0.5 MG tablet    Sig: Take 1 tablet (0.5 mg total) by mouth 2 (two) times daily.    Dispense:  60 tablet    Refill:  0    Follow-up: No follow-ups on file.    Cletis Athens, MD

## 2020-12-12 NOTE — Assessment & Plan Note (Signed)
Patient was advised to continue taking his medication and try to lose weight.

## 2020-12-12 NOTE — Assessment & Plan Note (Signed)
Stable

## 2020-12-12 NOTE — Assessment & Plan Note (Signed)

## 2020-12-12 NOTE — Assessment & Plan Note (Signed)
-   The patient's GERD is stable on medication.  - Instructed the patient to avoid eating spicy and acidic foods, as well as foods high in fat. - Instructed the patient to avoid eating large meals or meals 2-3 hours prior to sleeping. 

## 2020-12-12 NOTE — Assessment & Plan Note (Signed)
Complains of claudication walking a block

## 2020-12-12 NOTE — Assessment & Plan Note (Signed)
No recurrence.  Patient has claudication when walking

## 2020-12-12 NOTE — Assessment & Plan Note (Signed)
Patient is known to have atherosclerosis and he was advised to take statin medications.  He was advised to walk on a daily basis.  Patient does not smoke does not drink.

## 2021-02-18 ENCOUNTER — Other Ambulatory Visit: Payer: Self-pay | Admitting: *Deleted

## 2021-02-18 MED ORDER — ALPRAZOLAM 0.5 MG PO TABS: 0.5000 mg | ORAL_TABLET | Freq: Two times a day (BID) | ORAL | 0 refills | Status: DC

## 2021-02-18 MED ORDER — SERTRALINE HCL 100 MG PO TABS: ORAL_TABLET | ORAL | 0 refills | Status: DC

## 2021-04-22 ENCOUNTER — Other Ambulatory Visit: Payer: Self-pay

## 2021-04-22 MED ORDER — CLOPIDOGREL BISULFATE 75 MG PO TABS: 75.0000 mg | ORAL_TABLET | Freq: Every day | ORAL | 3 refills | Status: DC

## 2021-04-22 MED ORDER — ALPRAZOLAM 0.5 MG PO TABS: 0.5000 mg | ORAL_TABLET | Freq: Two times a day (BID) | ORAL | 1 refills | Status: DC

## 2021-05-27 ENCOUNTER — Other Ambulatory Visit: Payer: Self-pay

## 2021-05-27 MED ORDER — SERTRALINE HCL 100 MG PO TABS: ORAL_TABLET | ORAL | 3 refills | Status: AC

## 2021-06-11 ENCOUNTER — Other Ambulatory Visit: Payer: Self-pay | Admitting: *Deleted

## 2021-06-11 MED ORDER — ALPRAZOLAM 0.5 MG PO TABS: 0.5000 mg | ORAL_TABLET | Freq: Two times a day (BID) | ORAL | 1 refills | Status: DC

## 2021-07-23 ENCOUNTER — Other Ambulatory Visit: Payer: Self-pay

## 2021-07-23 ENCOUNTER — Encounter: Payer: Self-pay | Admitting: Cardiovascular Disease

## 2021-07-23 ENCOUNTER — Ambulatory Visit: Payer: No Typology Code available for payment source | Admitting: Cardiovascular Disease

## 2021-07-23 VITALS — BP 132/80 | HR 90 | Ht 70.0 in | Wt 277.0 lb

## 2021-07-23 DIAGNOSIS — I739 Peripheral vascular disease, unspecified: Secondary | ICD-10-CM | POA: Diagnosis not present

## 2021-07-23 DIAGNOSIS — I25118 Atherosclerotic heart disease of native coronary artery with other forms of angina pectoris: Secondary | ICD-10-CM | POA: Diagnosis not present

## 2021-07-23 DIAGNOSIS — I10 Essential (primary) hypertension: Secondary | ICD-10-CM

## 2021-07-23 DIAGNOSIS — E7800 Pure hypercholesterolemia, unspecified: Secondary | ICD-10-CM | POA: Diagnosis not present

## 2021-07-23 NOTE — Patient Instructions (Signed)
Medication Instructions:  ? ?Your physician recommends that you continue on your current medications as directed. Please refer to the Current Medication list given to you today. ? ? ?*If you need a refill on your cardiac medications before your next appointment, please call your pharmacy* ? ? ?Lab Work: [place] ? ? ?If you have labs (blood work) drawn today and your tests are completely normal, you will receive your results only by: ?MyChart Message (if you have MyChart) OR ?A paper copy in the mail ?If you have any lab test that is abnormal or we need to change your treatment, we will call you to review the results. ? ? ?Testing/Procedures: Your physician has requested that you have an ankle brachial index (ABI). During this test an ultrasound and blood pressure cuff are used to evaluate the arteries that supply the arms and legs with blood. Allow thirty minutes for this exam. There are no restrictions or special instructions. ? ? ? ?Follow-Up: ?At [facility], you and your health needs are our priority.  As part of our continuing mission to provide you with exceptional heart care, we have created designated Provider Care Teams.  These Care Teams include your primary Cardiologist (physician) and Advanced Practice Providers (APPs -  Physician Assistants and Nurse Practitioners) who all work together to provide you with the care you need, when you need it. ? ?We recommend signing up for the patient portal called "MyChart".  Sign up information is provided on this After Visit Summary.  MyChart is used to connect with patients for Virtual Visits (Telemedicine).  Patients are able to view lab/test results, encounter notes, upcoming appointments, etc.  Non-urgent messages can be sent to your provider as well.   ?To learn more about what you can do with MyChart, go to [email or web address].   ? ?Your next appointment:  REFER TO [place] ?1 year(s) ? ?The format for your next  appointment:   ?In Person ? ?Provider:   ?Lauree Chandler, MD   ? ? ?Other Instructions ? ?

## 2021-07-23 NOTE — Progress Notes (Signed)
? ?Chief Complaint  ?Patient presents with  ? Follow-up  ?  CAD ?  ? ?History of Present Illness: 66 yo male with history of CAD s/p PCI/stent followed by 4V CABG (LIMA to LAD, SVG to OM1, SVG to Ramus, SVG to PDA) October 2014 at [facility], PAD, HTN, COPD, tobacco abuse, HLD and sleep apnea here today for cardiac follow up. I saw him as a new patient September 2015 to establish cardiology care. He was admitted to [facility] October 2014 with chest pain and underwent 4V CABG. He is known to have PAD with occluded right SFA. Lower extremity dopplers in our office 02/01/14 with normal ABI on the left and slight reduction of ABI (0.92) on the right with documentation of occluded right SFA. Most recent LE dopplers in May 2018 with ABI 0.92 on the right and 1.1 on the left. We discussed referral to [facility] clinic but he refused this. He was admitted to [facility] January 2016 with chest pain. Troponin negative. Stress myoview 05/19/14 with no ischemia. Echo August 2019 with LVEF=65-70%. No valve disease. Nuclear stress test August 2019 with no ischemia. He had an echo per his primary care doctor in October 2020 with normal LV function and no valve disease. Cardiac monitor per his primary care doctor in October 2020 showed sinus bradycardia and his beta blocker was stopped. Nuclear stress test in March 2022 with no ischemia. No cardiac cath since he had his CABG.  ? ?He is here today for follow up. The patient denies any chest pain, dyspnea, palpitations, lower extremity edema, orthopnea, PND, dizziness, near syncope or syncope. He has been having pain in both legs with walking. He has left knee pain.  ?  ?Primary Care Physician: Cletis Athens, MD ? ?Past Medical History:  ?Diagnosis Date  ? Anxiety and depression   ? a. severe - seeing psychiatry.  ? CAD (coronary artery disease)   ? a. s/p 4V CABG 2014  b. 05/2014 MV: No ischemia, EF 69%.  ? Chronic prostatitis   ? COPD (chronic obstructive pulmonary disease) ([place])   ? ongoing tobacco use  ?  GERD (gastroesophageal reflux disease)   ? History of DVT (deep vein thrombosis)   ? Hyperlipidemia   ? Hyperlipidemia   ? Hypertension   ? a. renal arteries patent on remote cath.  ? Obesity   ? OSA (obstructive sleep apnea)   ? severe  ? PAD (peripheral artery disease) ([place])   ? a. 01/2014 ABIs R: 0.92, L 1.05.  Know total occlusion of right SFA with distal reconstitution-->Med Rx.  ? Sleep apnea   ? Tobacco abuse   ? a. 05/2014 down to 3-5 cigarettes day and vaping also.  ? ? ?Past Surgical History:  ?Procedure Laterality Date  ? CHOLECYSTECTOMY    ? CORONARY ARTERY BYPASS GRAFT  October 2014  ? 4V  ? CYSTOSCOPY    ? KNEE ARTHROSCOPY    ? right meniscal tear  ? ? ?Current Outpatient Medications  ?Medication Sig Dispense Refill  ? ALPRAZolam (XANAX) 0.5 MG tablet Take 1 tablet (0.5 mg total) by mouth 2 (two) times daily. 60 tablet 1  ? Ascorbic Acid (VITAMIN C PO) Take 2 tablets by mouth daily.    ? aspirin EC 81 MG tablet Take 81 mg by mouth daily.    ? atorvastatin (LIPITOR) 80 MG tablet Take 1 tablet (80 mg total) by mouth daily. 90 tablet 3  ? B Complex Vitamins (B COMPLEX PO) Take by mouth.    ?  clopidogrel (PLAVIX) 75 MG tablet Take 1 tablet (75 mg total) by mouth daily. 90 tablet 3  ? losartan (COZAAR) 100 MG tablet Take 1 tablet (100 mg total) by mouth daily. 90 tablet 3  ? MAGNESIUM PO Take by mouth.    ? Menaquinone-7 (VITAMIN K2 PO) Take by mouth.    ? Multiple Vitamins-Minerals (ZINC PO) Take by mouth.    ? nitroGLYCERIN (NITROSTAT) 0.4 MG SL tablet Place 1 tablet (0.4 mg total) under the tongue every 5 (five) minutes as needed for chest pain. 50 tablet 3  ? pantoprazole (PROTONIX) 40 MG tablet Take 1 tablet (40 mg total) by mouth daily. 90 tablet 3  ? sertraline (ZOLOFT) 100 MG tablet TAKE 1 & 1/2 (ONE & ONE-HALF) TABLETS BY MOUTH ONCE DAILY 180 tablet 3  ? VITAMIN D PO Take by mouth.    ? ?No current facility-administered medications for this visit.  ? ? ?Allergies  ?Allergen Reactions  ? Relafen  [Nabumetone] Other (See Comments)  ?  Pt doesn't remember   ? Sulfa Antibiotics Diarrhea  ? ? ?Social History  ? ?Socioeconomic History  ? Marital status: Married  ?  Spouse name: Not on file  ? Number of children: Not on file  ? Years of education: Not on file  ? Highest education level: Not on file  ?Occupational History  ? Not on file  ?Tobacco Use  ? Smoking status: Former  ?  Packs/day: 1.00  ?  Years: 40.00  ?  Pack years: 40.00  ?  Types: Cigarettes  ?  Quit date: 08/11/2014  ?  Years since quitting: 6.9  ? Smokeless tobacco: Never  ?Vaping Use  ? Vaping Use: Never used  ?Substance and Sexual Activity  ? Alcohol use: No  ?  Alcohol/week: 0.0 standard drinks  ? Drug use: No  ? Sexual activity: Yes  ?  Birth control/protection: None  ?Other Topics Concern  ? Not on file  ?Social History Narrative  ? Not on file  ? ?Social Determinants of Health  ? ?Financial Resource Strain: Not on file  ?Food Insecurity: Not on file  ?Transportation Needs: Not on file  ?Physical Activity: Not on file  ?Stress: Not on file  ?Social Connections: Not on file  ?Intimate Partner Violence: Not on file  ? ? ?Family History  ?Problem Relation Age of Onset  ? Hypertension Mother   ? Cataracts Mother   ? Diabetes Father   ? Adrenal disorder Neg Hx   ? ? ?Review of Systems:  As stated in the HPI and otherwise negative.  ? ?BP 132/80   Pulse 90   Ht '5\' 10"'$  (1.778 m)   Wt 277 lb (125.6 kg)   SpO2 97%   BMI 39.75 kg/m?  ? ?Physical Examination: ?General: Well developed, well nourished, NAD  ?HEENT: OP clear, mucus membranes moist  ?SKIN: warm, dry. No rashes. ?Neuro: No focal deficits  ?Musculoskeletal: Muscle strength 5/5 all ext  ?Psychiatric: Mood and affect normal  ?Neck: No JVD, no carotid bruits, no thyromegaly, no lymphadenopathy.  ?Lungs:Clear bilaterally, no wheezes, rhonci, crackles ?Cardiovascular: Regular rate and rhythm. No murmurs, gallops or rubs. ?Abdomen:Soft. Bowel sounds present. Non-tender.  ?Extremities: No lower  extremity edema. Unable to palpate pedal pulses.  ? ?EKG:  EKG is  ordered today. ?The ekg ordered today demonstrates sinus ? ?Echo August 2019:  ?- Left ventricle: The cavity size was normal. Systolic function was ?  vigorous. The estimated ejection fraction was in the range  of 65% ?  to 70%. Wall motion was normal; there were no regional wall ?  motion abnormalities. ?- Aortic valve: Trileaflet; normal thickness, mildly calcified ?  leaflets. ?- Right ventricle: The cavity size was mildly dilated. Wall ?  thickness was normal. ? ?Recent Labs: ?12/11/2020: ALT 34; BUN 14; Creat 1.03; Hemoglobin 15.7; Platelets 242; Potassium 5.3; Sodium 142; TSH 2.32  ? ?Lipid Panel ?   ?Component Value Date/Time  ? CHOL 224 (H) 12/11/2020 0920  ? CHOL 100 03/16/2014 1325  ? TRIG 187 (H) 12/11/2020 0920  ? TRIG 114 03/16/2014 1325  ? HDL 44 12/11/2020 0920  ? HDL 31 (L) 03/16/2014 1325  ? CHOLHDL 5.1 (H) 12/11/2020 0920  ? VLDL 13 10/26/2015 0744  ? VLDL 23 03/16/2014 1325  ? [place] 148 (H) 12/11/2020 0920  ? LDLCALC 46 03/16/2014 1325  ? LDLDIRECT 122.5 12/12/2008 0840  ? ?  ?Wt Readings from Last 3 Encounters:  ?07/23/21 277 lb (125.6 kg)  ?12/12/20 279 lb 14.4 oz (127 kg)  ?08/09/20 270 lb 6.4 oz (122.7 kg)  ?  ? ?Other studies Reviewed: ?Additional studies/ records that were reviewed today include: . ?Review of the above records demonstrates:  ? ? ?Assessment and Plan:  ? ?1. CAD with stable angina: No change in chronic chest pains. LV systolic function normal by echo in 2020. Stress test with no ischemia in 2022. Continue ASA, Plavix and statin. Beta blocker stopped due to bradycardia.   ? ?2. HTN: BP is controlled. No changes today ? ?3. HLD: Lipids are followed in primary care. LDL 148 in August 2022. He is not near goal. Continue statin and consider Repatha or Praluent. Will refer to lipid clinic.  ? ?4. Tobacco abuse, in remission: He has stopped smoking.  ? ?5. PAD: Known to have occluded right SFA. ABI slightly decreased  on right in 2018. He did not wish to pursue referral to the [facility] at that time. Now with bilateral leg pain with ambulation. Repeat ABI now.  ? ?Current medicines are reviewed at length with the patient today.

## 2021-07-25 ENCOUNTER — Other Ambulatory Visit: Payer: Self-pay | Admitting: *Deleted

## 2021-07-25 MED ORDER — PANTOPRAZOLE SODIUM 40 MG PO TBEC: 40.0000 mg | DELAYED_RELEASE_TABLET | Freq: Every day | ORAL | 3 refills | Status: AC

## 2021-08-05 ENCOUNTER — Other Ambulatory Visit: Payer: Self-pay | Admitting: Cardiovascular Disease

## 2021-08-05 DIAGNOSIS — I739 Peripheral vascular disease, unspecified: Secondary | ICD-10-CM

## 2021-08-05 DIAGNOSIS — I25118 Atherosclerotic heart disease of native coronary artery with other forms of angina pectoris: Secondary | ICD-10-CM

## 2021-08-19 ENCOUNTER — Ambulatory Visit: Payer: No Typology Code available for payment source

## 2021-08-20 ENCOUNTER — Encounter: Payer: Self-pay | Admitting: Internal Medicine

## 2021-08-20 ENCOUNTER — Ambulatory Visit (INDEPENDENT_AMBULATORY_CARE_PROVIDER_SITE_OTHER): Payer: No Typology Code available for payment source | Admitting: Internal Medicine

## 2021-08-20 VITALS — BP 132/84 | HR 78 | Ht 70.0 in | Wt 281.0 lb

## 2021-08-20 DIAGNOSIS — G4733 Obstructive sleep apnea (adult) (pediatric): Secondary | ICD-10-CM | POA: Diagnosis not present

## 2021-08-20 DIAGNOSIS — K219 Gastro-esophageal reflux disease without esophagitis: Secondary | ICD-10-CM

## 2021-08-20 DIAGNOSIS — I739 Peripheral vascular disease, unspecified: Secondary | ICD-10-CM

## 2021-08-20 DIAGNOSIS — Z6834 Body mass index (BMI) 34.0-34.9, adult: Secondary | ICD-10-CM

## 2021-08-20 DIAGNOSIS — I10 Essential (primary) hypertension: Secondary | ICD-10-CM

## 2021-08-20 DIAGNOSIS — J449 Chronic obstructive pulmonary disease, unspecified: Secondary | ICD-10-CM

## 2021-08-20 DIAGNOSIS — I25810 Atherosclerosis of coronary artery bypass graft(s) without angina pectoris: Secondary | ICD-10-CM

## 2021-08-20 DIAGNOSIS — E662 Morbid (severe) obesity with alveolar hypoventilation: Secondary | ICD-10-CM

## 2021-08-20 MED ORDER — ALPRAZOLAM 0.5 MG PO TABS: 0.5000 mg | ORAL_TABLET | Freq: Two times a day (BID) | ORAL | 1 refills | Status: DC

## 2021-08-20 NOTE — Assessment & Plan Note (Signed)
-   The patient's GERD is stable on medication.  - Instructed the patient to avoid eating spicy and acidic foods, as well as foods high in fat. - Instructed the patient to avoid eating large meals or meals 2-3 hours prior to sleeping. 

## 2021-08-20 NOTE — Assessment & Plan Note (Signed)
Stable

## 2021-08-20 NOTE — Progress Notes (Signed)
? ?Established Patient Office Visit ? ?Subjective:  ?Patient ID: Richard Burton, male    DOB: 1955-07-18  Age: 66 y.o. MRN: [id number] ? ?CC:  ?Chief Complaint  ?Patient presents with  ? Anxiety  ?  Patient here today for med refill   ? ? ?Anxiety ?Patient reports no chest pain.  ? ? ?Leg Pain  ?The incident occurred more than 1 week ago. There was no injury mechanism. The pain is present in the left leg and right leg. The quality of the pain is described as burning, aching and cramping. The pain is at a severity of 4/10. The pain is moderate. The pain has been Fluctuating since onset. Associated symptoms include a loss of sensation and muscle weakness. Pertinent negatives include no numbness. He has tried NSAIDs for the symptoms.  ? ?Lum B Samples presents for neuropathy ? ?Past Medical History:  ?Diagnosis Date  ? Anxiety and depression   ? a. severe - seeing psychiatry.  ? CAD (coronary artery disease)   ? a. s/p 4V CABG 2014  b. 05/2014 MV: No ischemia, EF 69%.  ? Chronic prostatitis   ? COPD (chronic obstructive pulmonary disease) ([place])   ? ongoing tobacco use  ? GERD (gastroesophageal reflux disease)   ? History of DVT (deep vein thrombosis)   ? Hyperlipidemia   ? Hyperlipidemia   ? Hypertension   ? a. renal arteries patent on remote cath.  ? Obesity   ? OSA (obstructive sleep apnea)   ? severe  ? PAD (peripheral artery disease) ([place])   ? a. 01/2014 ABIs R: 0.92, L 1.05.  Know total occlusion of right SFA with distal reconstitution-->Med Rx.  ? Sleep apnea   ? Tobacco abuse   ? a. 05/2014 down to 3-5 cigarettes day and vaping also.  ? ? ?Past Surgical History:  ?Procedure Laterality Date  ? CHOLECYSTECTOMY    ? CORONARY ARTERY BYPASS GRAFT  October 2014  ? 4V  ? CYSTOSCOPY    ? KNEE ARTHROSCOPY    ? right meniscal tear  ? ? ?Family History  ?Problem Relation Age of Onset  ? Hypertension Mother   ? Cataracts Mother   ? Diabetes Father   ? Adrenal disorder Neg Hx   ? ? ?Social History  ? ?Socioeconomic History   ? Marital status: Married  ?  Spouse name: Not on file  ? Number of children: Not on file  ? Years of education: Not on file  ? Highest education level: Not on file  ?Occupational History  ? Not on file  ?Tobacco Use  ? Smoking status: Former  ?  Packs/day: 1.00  ?  Years: 40.00  ?  Pack years: 40.00  ?  Types: Cigarettes  ?  Quit date: 08/11/2014  ?  Years since quitting: 7.0  ? Smokeless tobacco: Never  ?Vaping Use  ? Vaping Use: Never used  ?Substance and Sexual Activity  ? Alcohol use: No  ?  Alcohol/week: 0.0 standard drinks  ? Drug use: No  ? Sexual activity: Yes  ?  Birth control/protection: None  ?Other Topics Concern  ? Not on file  ?Social History Narrative  ? Not on file  ? ?Social Determinants of Health  ? ?Financial Resource Strain: Not on file  ?Food Insecurity: Not on file  ?Transportation Needs: Not on file  ?Physical Activity: Not on file  ?Stress: Not on file  ?Social Connections: Not on file  ?Intimate Partner Violence: Not on file  ? ? ? ?  Current Outpatient Medications:  ?  Ascorbic Acid (VITAMIN C PO), Take 2 tablets by mouth daily., Disp: , Rfl:  ?  aspirin EC 81 MG tablet, Take 81 mg by mouth daily., Disp: , Rfl:  ?  atorvastatin (LIPITOR) 80 MG tablet, Take 1 tablet (80 mg total) by mouth daily., Disp: 90 tablet, Rfl: 3 ?  B Complex Vitamins (B COMPLEX PO), Take by mouth., Disp: , Rfl:  ?  clopidogrel (PLAVIX) 75 MG tablet, Take 1 tablet (75 mg total) by mouth daily., Disp: 90 tablet, Rfl: 3 ?  losartan (COZAAR) 100 MG tablet, Take 1 tablet (100 mg total) by mouth daily., Disp: 90 tablet, Rfl: 3 ?  MAGNESIUM PO, Take by mouth., Disp: , Rfl:  ?  Menaquinone-7 (VITAMIN K2 PO), Take by mouth., Disp: , Rfl:  ?  Multiple Vitamins-Minerals (ZINC PO), Take by mouth., Disp: , Rfl:  ?  nitroGLYCERIN (NITROSTAT) 0.4 MG SL tablet, Place 1 tablet (0.4 mg total) under the tongue every 5 (five) minutes as needed for chest pain., Disp: 50 tablet, Rfl: 3 ?  pantoprazole (PROTONIX) 40 MG tablet, Take 1 tablet  (40 mg total) by mouth daily., Disp: 90 tablet, Rfl: 3 ?  sertraline (ZOLOFT) 100 MG tablet, TAKE 1 & 1/2 (ONE & ONE-HALF) TABLETS BY MOUTH ONCE DAILY, Disp: 180 tablet, Rfl: 3 ?  VITAMIN D PO, Take by mouth., Disp: , Rfl:  ?  ALPRAZolam (XANAX) 0.5 MG tablet, Take 1 tablet (0.5 mg total) by mouth 2 (two) times daily., Disp: 60 tablet, Rfl: 1  ? ?Allergies  ?Allergen Reactions  ? Relafen [Nabumetone] Other (See Comments)  ?  Pt doesn't remember   ? Sulfa Antibiotics Diarrhea  ? ? ?ROS ?Review of Systems  ?Constitutional: Negative.  Negative for appetite change and chills.  ?HENT: Negative.    ?Eyes: Negative.   ?Respiratory: Negative.  Negative for cough and chest tightness.   ?Cardiovascular: Negative.  Negative for chest pain.  ?Gastrointestinal: Negative.  Negative for abdominal distention.  ?Endocrine: Negative.   ?Genitourinary: Negative.   ?Musculoskeletal: Negative.   ?Skin: Negative.   ?Allergic/Immunologic: Negative.   ?Neurological: Negative.  Negative for numbness.  ?Hematological: Negative.  Negative for adenopathy.  ?Psychiatric/Behavioral: Negative.    ?All other systems reviewed and are negative. ? ?  ?Objective:  ?  ?Physical Exam ?Vitals reviewed.  ?Constitutional:   ?   Appearance: Normal appearance.  ?HENT:  ?   Mouth/Throat:  ?   Mouth: Mucous membranes are moist.  ?Eyes:  ?   Pupils: Pupils are equal, round, and reactive to light.  ?Neck:  ?   Vascular: No carotid bruit.  ?Cardiovascular:  ?   Rate and Rhythm: Normal rate and regular rhythm.  ?   Pulses: Normal pulses.  ?   Heart sounds: Normal heart sounds.  ?Pulmonary:  ?   Effort: Pulmonary effort is normal.  ?   Breath sounds: Normal breath sounds.  ?Abdominal:  ?   General: Bowel sounds are normal.  ?   Palpations: Abdomen is soft. There is no hepatomegaly, splenomegaly or mass.  ?   Tenderness: There is no abdominal tenderness.  ?   Hernia: No hernia is present.  ?Musculoskeletal:  ?   Cervical back: Neck supple.  ?   Right lower leg: No  edema.  ?   Left lower leg: No edema.  ?Skin: ?   Findings: No rash.  ?Neurological:  ?   Mental Status: He is alert and oriented to person,  place, and time.  ?   Motor: No weakness.  ?Psychiatric:     ?   Mood and Affect: Mood normal.     ?   Behavior: Behavior normal.  ? ? ?BP 132/84   Pulse 78   Ht _0  (1.778 m)   Wt 281 lb (127.5 kg)   BMI 40.32 kg/m?  ?Wt Readings from Last 3 Encounters:  ?08/20/21 281 lb (127.5 kg)  ?07/23/21 277 lb (125.6 kg)  ?12/12/20 279 lb 14.4 oz (127 kg)  ? ? ? ?Health Maintenance Due  ?Topic Date Due  ? HIV Screening  Never done  ? Hepatitis C Screening  Never done  ? Zoster Vaccines- Shingrix (1 of 2) Never done  ? TETANUS/TDAP  12/19/2014  ? COVID-19 Vaccine (3 - Booster for Pfizer series) 07/31/2020  ? ? ?There are no preventive care reminders to display for this patient. ? ?Lab Results  ?Component Value Date  ? TSH 2.32 12/11/2020  ? ?Lab Results  ?Component Value Date  ? WBC 7.4 12/11/2020  ? HGB 15.7 12/11/2020  ? HCT 47.3 12/11/2020  ? MCV 89.4 12/11/2020  ? PLT 242 12/11/2020  ? ?Lab Results  ?Component Value Date  ? NA 142 12/11/2020  ? K 5.3 12/11/2020  ? CO2 26 12/11/2020  ? GLUCOSE 130 (H) 12/11/2020  ? BUN 14 12/11/2020  ? CREATININE 1.03 12/11/2020  ? BILITOT 0.4 12/11/2020  ? ALKPHOS 69 10/26/2015  ? AST 28 12/11/2020  ? ALT 34 12/11/2020  ? PROT 7.2 12/11/2020  ? ALBUMIN 4.3 10/26/2015  ? CALCIUM 9.6 12/11/2020  ? ANIONGAP 14 05/19/2014  ? EGFR 82 12/11/2020  ? ?Lab Results  ?Component Value Date  ? CHOL 224 (H) 12/11/2020  ? ?Lab Results  ?Component Value Date  ? HDL 44 12/11/2020  ? ?Lab Results  ?Component Value Date  ? LDLCALC 148 (H) 12/11/2020  ? ?Lab Results  ?Component Value Date  ? TRIG 187 (H) 12/11/2020  ? ?Lab Results  ?Component Value Date  ? CHOLHDL 5.1 (H) 12/11/2020  ? ?Lab Results  ?Component Value Date  ? HGBA1C 6.5 (H) 12/11/2020  ? ? ?  ?Assessment & Plan:  ? ?Problem List Items Addressed This Visit   ? ?  ? Cardiovascular and Mediastinum  ?  Essential hypertension  ?  Blood pressure is stable right now ?  ?  ? CAD (coronary artery disease)  ?  Patient does not have any chest pain chest is clear no rales or rhonchi are noted.  There is no pedal

## 2021-08-20 NOTE — Assessment & Plan Note (Signed)
Patient does not smoke anymore ?

## 2021-08-20 NOTE — Assessment & Plan Note (Signed)
Blood pressure is stable right now ?

## 2021-08-20 NOTE — Assessment & Plan Note (Signed)
Patient was advised to lose weight 

## 2021-08-20 NOTE — Assessment & Plan Note (Signed)
Patient does not have any chest pain chest is clear no rales or rhonchi are noted.  There is no pedal edema. ?

## 2021-08-20 NOTE — Assessment & Plan Note (Signed)
Patient has a history of PAD in the past.  Will be reevaluated because of the pain in the legs.  Part of the pain may be coming from neuropathy he has a EMG done before. ?

## 2021-08-26 ENCOUNTER — Ambulatory Visit (HOSPITAL_COMMUNITY)
Admission: RE | Admit: 2021-08-26 | Discharge: 2021-08-26 | Disposition: A | Discharge status: Home / Self Care | Payer: No Typology Code available for payment source | Source: Ambulatory Visit | Attending: Cardiology | Admitting: Cardiology

## 2021-08-26 DIAGNOSIS — I25118 Atherosclerotic heart disease of native coronary artery with other forms of angina pectoris: Secondary | ICD-10-CM | POA: Insufficient documentation

## 2021-08-26 DIAGNOSIS — I739 Peripheral vascular disease, unspecified: Secondary | ICD-10-CM | POA: Diagnosis not present

## 2021-09-24 ENCOUNTER — Other Ambulatory Visit: Payer: Self-pay

## 2021-09-24 MED ORDER — LOSARTAN POTASSIUM 100 MG PO TABS: 100.0000 mg | ORAL_TABLET | Freq: Every day | ORAL | 3 refills | Status: AC

## 2021-10-21 ENCOUNTER — Other Ambulatory Visit: Payer: Self-pay | Admitting: *Deleted

## 2021-10-21 MED ORDER — ALPRAZOLAM 0.5 MG PO TABS: 0.5000 mg | ORAL_TABLET | Freq: Two times a day (BID) | ORAL | 1 refills | Status: DC

## 2022-02-24 ENCOUNTER — Ambulatory Visit (INDEPENDENT_AMBULATORY_CARE_PROVIDER_SITE_OTHER): Payer: No Typology Code available for payment source | Admitting: Internal Medicine

## 2022-02-24 ENCOUNTER — Encounter: Payer: Self-pay | Admitting: Internal Medicine

## 2022-02-24 VITALS — BP 130/84 | HR 86 | Ht 70.0 in | Wt 273.0 lb

## 2022-02-24 DIAGNOSIS — I10 Essential (primary) hypertension: Secondary | ICD-10-CM | POA: Diagnosis not present

## 2022-02-24 DIAGNOSIS — G629 Polyneuropathy, unspecified: Secondary | ICD-10-CM

## 2022-02-24 DIAGNOSIS — M722 Plantar fascial fibromatosis: Secondary | ICD-10-CM | POA: Insufficient documentation

## 2022-02-24 DIAGNOSIS — R7309 Other abnormal glucose: Secondary | ICD-10-CM | POA: Diagnosis not present

## 2022-02-24 DIAGNOSIS — Z0000 Encounter for general adult medical examination without abnormal findings: Secondary | ICD-10-CM | POA: Diagnosis not present

## 2022-02-24 DIAGNOSIS — G4733 Obstructive sleep apnea (adult) (pediatric): Secondary | ICD-10-CM

## 2022-02-24 DIAGNOSIS — J029 Acute pharyngitis, unspecified: Secondary | ICD-10-CM

## 2022-02-24 DIAGNOSIS — E662 Morbid (severe) obesity with alveolar hypoventilation: Secondary | ICD-10-CM

## 2022-02-24 DIAGNOSIS — Z6834 Body mass index (BMI) 34.0-34.9, adult: Secondary | ICD-10-CM

## 2022-02-24 DIAGNOSIS — I25810 Atherosclerosis of coronary artery bypass graft(s) without angina pectoris: Secondary | ICD-10-CM

## 2022-02-24 DIAGNOSIS — J069 Acute upper respiratory infection, unspecified: Secondary | ICD-10-CM

## 2022-02-24 DIAGNOSIS — E7800 Pure hypercholesterolemia, unspecified: Secondary | ICD-10-CM | POA: Diagnosis not present

## 2022-02-24 DIAGNOSIS — I889 Nonspecific lymphadenitis, unspecified: Secondary | ICD-10-CM | POA: Insufficient documentation

## 2022-02-24 LAB — POC COVID19 BINAXNOW: SARS Coronavirus 2 Ag: NEGATIVE

## 2022-02-24 MED ORDER — GABAPENTIN 100 MG PO CAPS: 100.0000 mg | ORAL_CAPSULE | Freq: Three times a day (TID) | ORAL | 3 refills | Status: DC

## 2022-02-24 MED ORDER — AZITHROMYCIN 250 MG PO TABS: ORAL_TABLET | ORAL | 0 refills | Status: AC

## 2022-02-24 MED ORDER — CEPHALEXIN 500 MG PO CAPS: 500.0000 mg | ORAL_CAPSULE | Freq: Four times a day (QID) | ORAL | 0 refills | Status: DC

## 2022-02-24 MED ORDER — GABAPENTIN 100 MG PO CAPS: 100.0000 mg | ORAL_CAPSULE | Freq: Three times a day (TID) | ORAL | 3 refills | Status: AC

## 2022-02-24 NOTE — Assessment & Plan Note (Signed)
Refer to podiatrist 

## 2022-02-24 NOTE — Progress Notes (Signed)
Established Patient Office Visit  Subjective:  Patient ID: Richard Burton, male    DOB: 04-14-1956  Age: 66 y.o. MRN: [id number]  CC:  Chief Complaint  Patient presents with   Annual Exam    HPI  Richard Burton presents sore throat  Past Medical History:  Diagnosis Date   Anxiety and depression    a. severe - seeing psychiatry.   CAD (coronary artery disease)    a. s/p 4V CABG 2014  b. 05/2014 MV: No ischemia, EF 69%.   Chronic prostatitis    COPD (chronic obstructive pulmonary disease) (HCC)    ongoing tobacco use   GERD (gastroesophageal reflux disease)    History of DVT (deep vein thrombosis)    Hyperlipidemia    Hyperlipidemia    Hypertension    a. renal arteries patent on remote cath.   Obesity    OSA (obstructive sleep apnea)    severe   PAD (peripheral artery disease) (HCC)    a. 01/2014 ABIs R: 0.92, L 1.05.  Know total occlusion of right SFA with distal reconstitution-->Med Rx.   Sleep apnea    Tobacco abuse    a. 05/2014 down to 3-5 cigarettes day and vaping also.    Past Surgical History:  Procedure Laterality Date   CHOLECYSTECTOMY     CORONARY ARTERY BYPASS GRAFT  October 2014   4V   CYSTOSCOPY     KNEE ARTHROSCOPY     right meniscal tear    Family History  Problem Relation Age of Onset   Hypertension Mother    Cataracts Mother    Diabetes Father    Adrenal disorder Neg Hx     Social History   Socioeconomic History   Marital status: Married    Spouse name: Not on file   Number of children: Not on file   Years of education: Not on file   Highest education level: Not on file  Occupational History   Not on file  Tobacco Use   Smoking status: Former    Packs/day: 1.00    Years: 40.00    Total pack years: 40.00    Types: Cigarettes    Quit date: 08/11/2014    Years since quitting: 7.5   Smokeless tobacco: Never  Vaping Use   Vaping Use: Never used  Substance and Sexual Activity   Alcohol use: No    Alcohol/week: 0.0 standard  drinks of alcohol   Drug use: No   Sexual activity: Yes    Birth control/protection: None  Other Topics Concern   Not on file  Social History Narrative   Not on file   Social Determinants of Health   Financial Resource Strain: Not on file  Food Insecurity: Not on file  Transportation Needs: Not on file  Physical Activity: Not on file  Stress: Not on file  Social Connections: Not on file  Intimate Partner Violence: Not on file     Current Outpatient Medications:    ALPRAZolam (XANAX) 0.5 MG tablet, Take 1 tablet (0.5 mg total) by mouth 2 (two) times daily., Disp: 60 tablet, Rfl: 1   Ascorbic Acid (VITAMIN C PO), Take 2 tablets by mouth daily., Disp: , Rfl:    aspirin EC 81 MG tablet, Take 81 mg by mouth daily., Disp: , Rfl:    atorvastatin (LIPITOR) 80 MG tablet, Take 1 tablet (80 mg total) by mouth daily., Disp: 90 tablet, Rfl: 3   B Complex Vitamins (B COMPLEX PO), Take by mouth.,  Disp: , Rfl:    clopidogrel (PLAVIX) 75 MG tablet, Take 1 tablet (75 mg total) by mouth daily., Disp: 90 tablet, Rfl: 3   losartan (COZAAR) 100 MG tablet, Take 1 tablet (100 mg total) by mouth daily., Disp: 90 tablet, Rfl: 3   MAGNESIUM PO, Take by mouth., Disp: , Rfl:    Menaquinone-7 (VITAMIN K2 PO), Take by mouth., Disp: , Rfl:    Multiple Vitamins-Minerals (ZINC PO), Take by mouth., Disp: , Rfl:    nitroGLYCERIN (NITROSTAT) 0.4 MG SL tablet, Place 1 tablet (0.4 mg total) under the tongue every 5 (five) minutes as needed for chest pain., Disp: 50 tablet, Rfl: 3   pantoprazole (PROTONIX) 40 MG tablet, Take 1 tablet (40 mg total) by mouth daily., Disp: 90 tablet, Rfl: 3   sertraline (ZOLOFT) 100 MG tablet, TAKE 1 & 1/2 (ONE & ONE-HALF) TABLETS BY MOUTH ONCE DAILY, Disp: 180 tablet, Rfl: 3   VITAMIN D PO, Take by mouth., Disp: , Rfl:    Allergies  Allergen Reactions   Relafen [Nabumetone] Other (See Comments)    Pt doesn't remember    Sulfa Antibiotics Diarrhea    ROS Review of Systems   Constitutional: Negative.   HENT: Negative.    Eyes: Negative.   Respiratory: Negative.    Cardiovascular: Negative.   Gastrointestinal: Negative.   Endocrine: Negative.   Genitourinary: Negative.   Musculoskeletal: Negative.   Skin: Negative.   Allergic/Immunologic: Negative.   Neurological: Negative.   Hematological: Negative.   Psychiatric/Behavioral: Negative.    All other systems reviewed and are negative.     Objective:    Physical Exam Vitals reviewed.  Constitutional:      Appearance: Normal appearance.  HENT:     Mouth/Throat:     Mouth: Mucous membranes are moist.  Eyes:     Pupils: Pupils are equal, round, and reactive to light.  Neck:     Vascular: No carotid bruit.  Cardiovascular:     Rate and Rhythm: Normal rate and regular rhythm.     Pulses: Normal pulses.     Heart sounds: Normal heart sounds.  Pulmonary:     Effort: Pulmonary effort is normal.     Breath sounds: Normal breath sounds.  Abdominal:     General: Bowel sounds are normal.     Palpations: Abdomen is soft. There is no hepatomegaly, splenomegaly or mass.     Tenderness: There is no abdominal tenderness.     Hernia: No hernia is present.  Musculoskeletal:     Cervical back: Neck supple.     Right lower leg: No edema.     Left lower leg: No edema.     Comments: Planter fasciatis  Skin:    Findings: No rash.  Neurological:     Mental Status: He is alert and oriented to person, place, and time.     Motor: No weakness.  Psychiatric:        Mood and Affect: Mood normal.        Behavior: Behavior normal.     BP 130/84   Pulse 86   Ht $R'5\' 10"'rl$  (1.778 m)   Wt 273 lb (123.8 kg)   BMI 39.17 kg/m  Wt Readings from Last 3 Encounters:  02/24/22 273 lb (123.8 kg)  08/20/21 281 lb (127.5 kg)  07/23/21 277 lb (125.6 kg)     Health Maintenance Due  Topic Date Due   INFLUENZA VACCINE  12/10/2021    There are no preventive care  reminders to display for this patient.  Lab Results   Component Value Date   TSH 2.32 12/11/2020   Lab Results  Component Value Date   WBC 7.4 12/11/2020   HGB 15.7 12/11/2020   HCT 47.3 12/11/2020   MCV 89.4 12/11/2020   PLT 242 12/11/2020   Lab Results  Component Value Date   NA 142 12/11/2020   K 5.3 12/11/2020   CO2 26 12/11/2020   GLUCOSE 130 (H) 12/11/2020   BUN 14 12/11/2020   CREATININE 1.03 12/11/2020   BILITOT 0.4 12/11/2020   ALKPHOS 69 10/26/2015   AST 28 12/11/2020   ALT 34 12/11/2020   PROT 7.2 12/11/2020   ALBUMIN 4.3 10/26/2015   CALCIUM 9.6 12/11/2020   ANIONGAP 14 05/19/2014   EGFR 82 12/11/2020   Lab Results  Component Value Date   CHOL 224 (H) 12/11/2020   Lab Results  Component Value Date   HDL 44 12/11/2020   Lab Results  Component Value Date   LDLCALC 148 (H) 12/11/2020   Lab Results  Component Value Date   TRIG 187 (H) 12/11/2020   Lab Results  Component Value Date   CHOLHDL 5.1 (H) 12/11/2020   Lab Results  Component Value Date   HGBA1C 6.5 (H) 12/11/2020      Assessment & Plan:   Problem List Items Addressed This Visit       Cardiovascular and Mediastinum   Essential hypertension     Patient denies any chest pain or shortness of breath there is no history of palpitation or paroxysmal nocturnal dyspnea   patient was advised to follow low-salt low-cholesterol diet    ideally I want to keep systolic blood pressure below 130 mmHg, patient was asked to check blood pressure one times a week and give me a report on that.  Patient will be follow-up in 3 months  or earlier as needed, patient will call me back for any change in the cardiovascular symptoms Patient was advised to buy a book from local bookstore concerning blood pressure and read several chapters  every day.  This will be supplemented by some of the material we will give him from the office.  Patient should also utilize other resources like YouTube and Internet to learn more about the blood pressure and the diet.       Relevant Orders   CBC with Differential/Platelet   COMPLETE METABOLIC PANEL WITH GFR   Lipid panel   TSH   PSA   Hemoglobin A1c     Respiratory   OSA (obstructive sleep apnea)    Patient was advised sleep study        Musculoskeletal and Integument   Plantar fasciitis    Refer to podiatrist        Immune and Lymphatic   Axillary adenitis    Started on cephalexin        Other   HLD (hyperlipidemia)   Relevant Orders   CBC with Differential/Platelet   COMPLETE METABOLIC PANEL WITH GFR   Lipid panel   TSH   PSA   Hemoglobin A1c   HYPERGLYCEMIA   Relevant Orders   CBC with Differential/Platelet   COMPLETE METABOLIC PANEL WITH GFR   Lipid panel   TSH   PSA   Hemoglobin A1c   Annual physical exam    Patient general physical examination is normal he is obese neck is supple heart is regular chest is clear there is lymphadenitis of the right axilla.  Abdominal soft  nontender without any hepatosplenomegaly there is no pedal edema no calf tenderness.  Patient was advised to lose weight.      Relevant Orders   CBC with Differential/Platelet   COMPLETE METABOLIC PANEL WITH GFR   Lipid panel   TSH   PSA   Hemoglobin A1c   Class 1 obesity with alveolar hypoventilation, serious comorbidity, and body mass index (BMI) of 34.0 to 34.9 in adult [facility])    - I encouraged the patient to lose weight.  - I educated them on making healthy dietary choices including eating more fruits and vegetables and less fried foods. - I encouraged the patient to exercise more, and educated on the benefits of exercise including weight loss, diabetes prevention, and hypertension prevention.   Dietary counseling with a registered dietician  Referral to a weight management support group (e.g. Weight Watchers, Overeaters Anonymous)  If your BMI is greater than 29 or you have gained more than 15 pounds you should work on weight loss.  Attend a healthy cooking class       Other Visit Diagnoses      Sore throat    -  Primary   Relevant Orders   POC COVID-19 BinaxNow     Hypercholesterolemia  I advised the patient to follow Mediterranean diet This diet is rich in fruits vegetables and whole grain, and This diet is also rich in fish and lean meat Patient should also eat a handful of almonds or walnuts daily Recent heart study indicated that average follow-up on this kind of diet reduces the cardiovascular mortality by 50 to 70 DASH Diet: Care Instructions Your Care Instructions  The DASH diet is an eating plan that can help lower your blood pressure. DASH stands for Dietary Approaches to Stop Hypertension. Hypertension is high blood pressure. The DASH diet focuses on eating foods that are high in calcium, potassium, and magnesium. These nutrients can lower blood pressure. The foods that are highest in these nutrients are fruits, vegetables, low-fat dairy products, nuts, seeds, and legumes. But taking calcium, potassium, and magnesium supplements instead of eating foods that are high in those nutrients does not have the same effect. The DASH diet also includes whole grains, fish, and poultry. The DASH diet is one of several lifestyle changes your doctor may recommend to lower your high blood pressure. Your doctor may also want you to decrease the amount of sodium in your diet. Lowering sodium while following the DASH diet can lower blood pressure even further than just the DASH diet alone. Follow-up care is a key part of your treatment and safety. Be sure to make and go to all appointments, and call your doctor if you are having problems. It's also a good idea to know your test results and keep a list of the medicines you take. How can you care for yourself at home? Following the DASH diet  Eat 4 to 5 servings of fruit each day. A serving is 1 medium-sized piece of fruit,  cup chopped or canned fruit, 1/4 cup dried fruit, or 4 ounces ( cup) of fruit juice. Choose fruit more often than  fruit juice.  Eat 4 to 5 servings of vegetables each day. A serving is 1 cup of lettuce or raw leafy vegetables,  cup of chopped or cooked vegetables, or 4 ounces ( cup) of vegetable juice. Choose vegetables more often than vegetable juice.  Get 2 to 3 servings of low-fat and fat-free dairy each day. A serving is 8 ounces of  milk, 1 cup of yogurt, or 1  ounces of cheese.  Eat 6 to 8 servings of grains each day. A serving is 1 slice of bread, 1 ounce of dry cereal, or  cup of cooked rice, pasta, or cooked cereal. Try to choose whole-grain products as much as possible.  Limit lean meat, poultry, and fish to 2 servings each day. A serving is 3 ounces, about the size of a deck of cards.  Eat 4 to 5 servings of nuts, seeds, and legumes (cooked dried beans, lentils, and split peas) each week. A serving is 1/3 cup of nuts, 2 tablespoons of seeds, or  cup of cooked beans or peas.  Limit fats and oils to 2 to 3 servings each day. A serving is 1 teaspoon of vegetable oil or 2 tablespoons of salad dressing.  Limit sweets and added sugars to 5 servings or less a week. A serving is 1 tablespoon jelly or jam,  cup sorbet, or 1 cup of lemonade.  Eat less than 2,300 milligrams (mg) of sodium a day. If you limit your sodium to 1,500 mg a day, you can lower your blood pressure even more. Tips for success  Start small. Do not try to make dramatic changes to your diet all at once. You might feel that you are missing out on your favorite foods and then be more likely to not follow the plan. Make small changes, and stick with them. Once those changes become habit, add a few more changes.  Try some of the following: ? Make it a goal to eat a fruit or vegetable at every meal and at snacks. This will make it easy to get the recommended amount of fruits and vegetables each day. ? Try yogurt topped with fruit and nuts for a snack or healthy dessert. ? Add lettuce, tomato, cucumber, and onion to sandwiches. ?  Combine a ready-made pizza crust with low-fat mozzarella cheese and lots of vegetable toppings. Try using tomatoes, squash, spinach, broccoli, carrots, cauliflower, and onions. ? Have a variety of cut-up vegetables with a low-fat dip as an appetizer instead of chips and dip. ? Sprinkle sunflower seeds or chopped almonds over salads. Or try adding chopped walnuts or almonds to cooked vegetables. ? Try some vegetarian meals using beans and peas. Add garbanzo or kidney beans to salads. Make burritos and tacos with mashed pinto beans or black beans.  No orders of the defined types were placed in this encounter.   Follow-up: No follow-ups on file.    Cletis Athens, MD

## 2022-02-24 NOTE — Assessment & Plan Note (Signed)
Patient general physical examination is normal he is obese neck is supple heart is regular chest is clear there is lymphadenitis of the right axilla.  Abdominal soft nontender without any hepatosplenomegaly there is no pedal edema no calf tenderness.  Patient was advised to lose weight.

## 2022-02-24 NOTE — Assessment & Plan Note (Signed)

## 2022-02-24 NOTE — Assessment & Plan Note (Signed)
Started on cephalexin

## 2022-02-24 NOTE — Assessment & Plan Note (Signed)
Patient was advised sleep study

## 2022-02-24 NOTE — Assessment & Plan Note (Signed)

## 2022-03-02 IMAGING — CT CT RENAL STONE PROTOCOL
1 of 2 series · 15 of 32 positions shown, 19 images · non-contrast
Comparison: 12/10/2005.

CLINICAL DATA: Right flank pain and back pain for 1 month.  Nausea.

EXAM:
CT ABDOMEN AND PELVIS WITHOUT CONTRAST
TECHNIQUE: Multidetector CT imaging of the abdomen and pelvis was performed
following the standard protocol without IV contrast.

[Series 2: axial st · axial · 0.89mm/px · z∈[-1234,-770]mm · 15 of 103 slices shown, 19 images]
[im 5/103  soft-tissue]
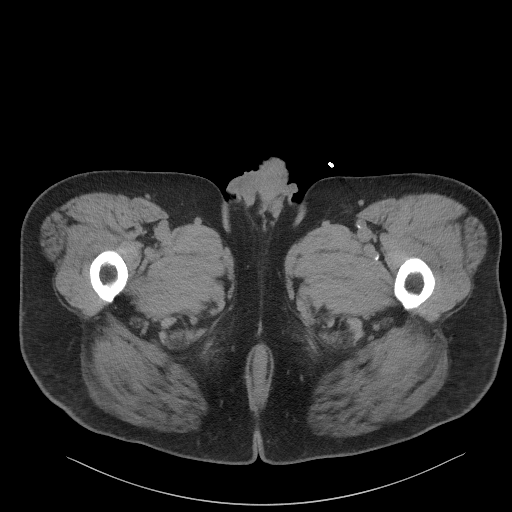
[im 5/103  bone]
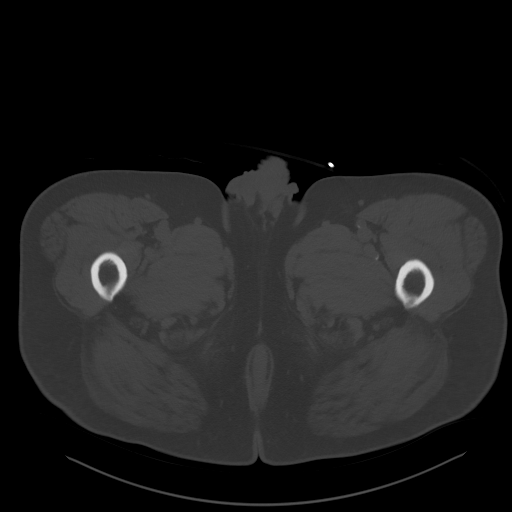
[im 13/103  soft-tissue]
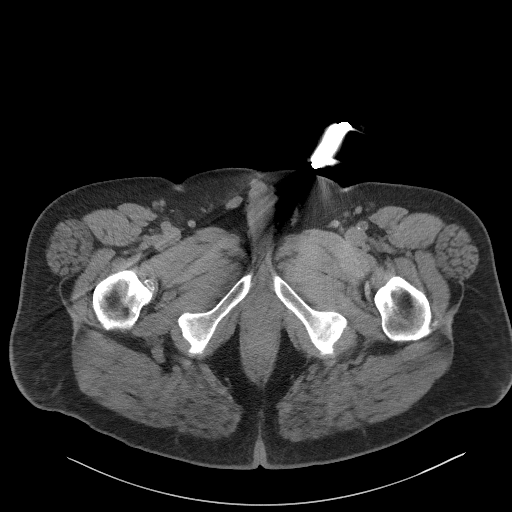
[im 21/103  soft-tissue]
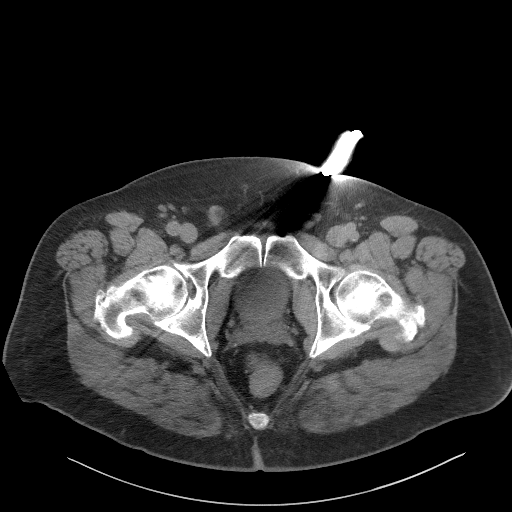
[im 29/103  soft-tissue]
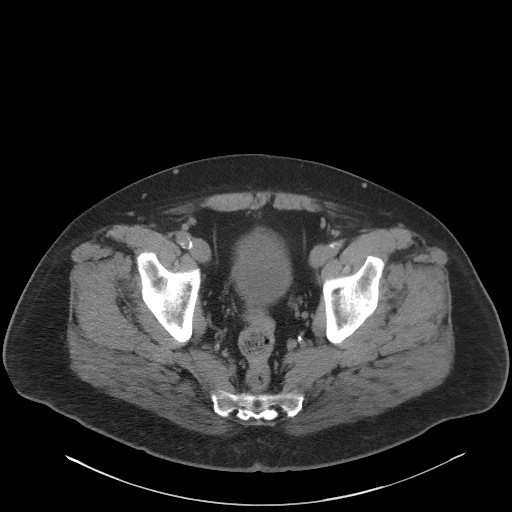
[im 37/103  soft-tissue]
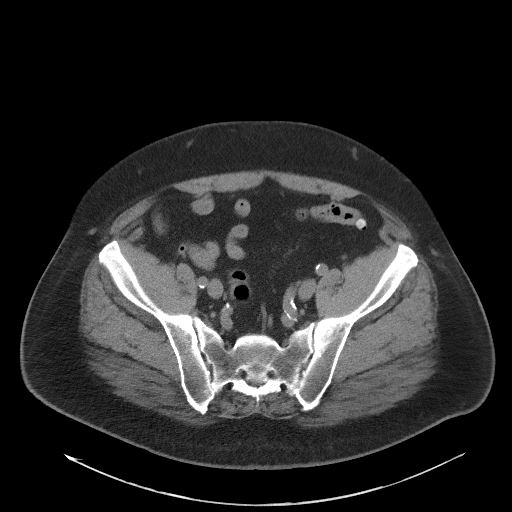
[im 45/103  soft-tissue]
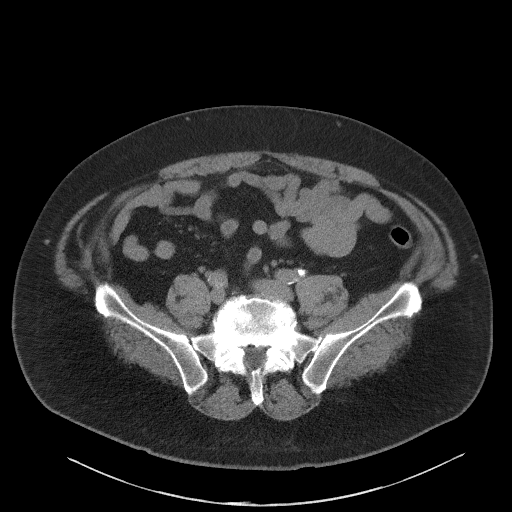
[im 54/103  soft-tissue]
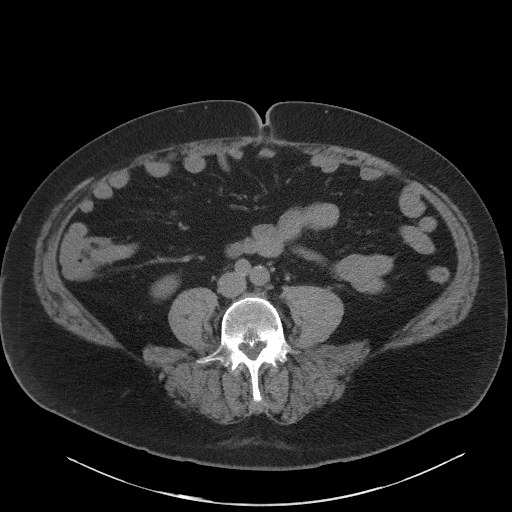
[im 58/103  soft-tissue]
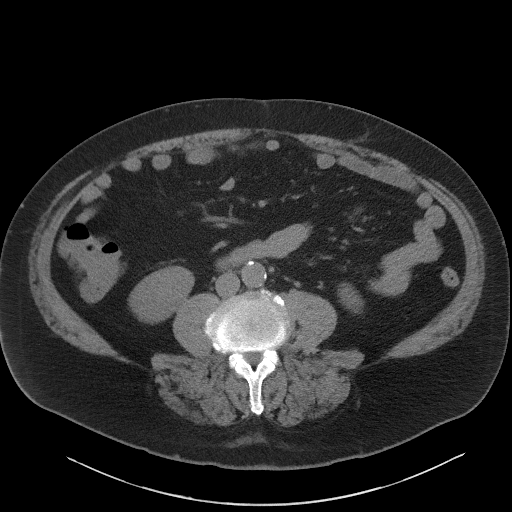
[im 66/103  soft-tissue]
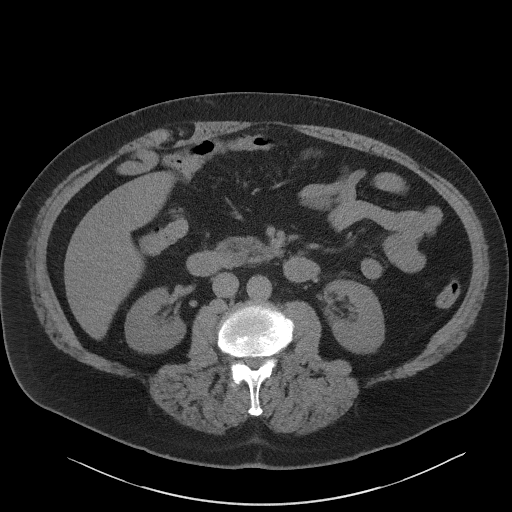
[im 66/103  bone]
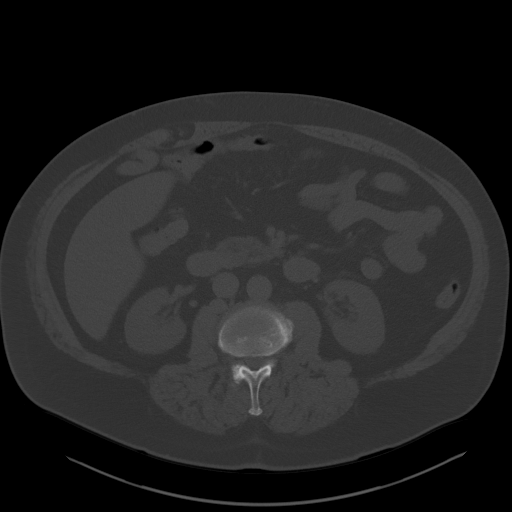
[im 74/103  soft-tissue]
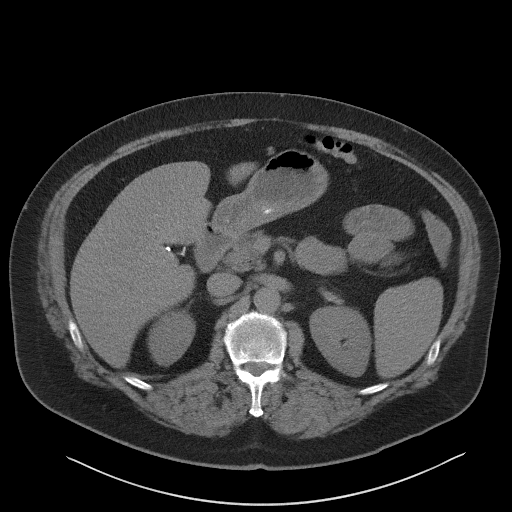
[im 82/103  soft-tissue]
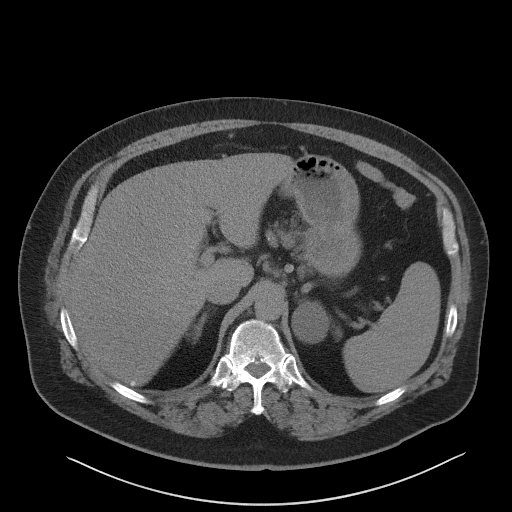
[im 86/103  lung]
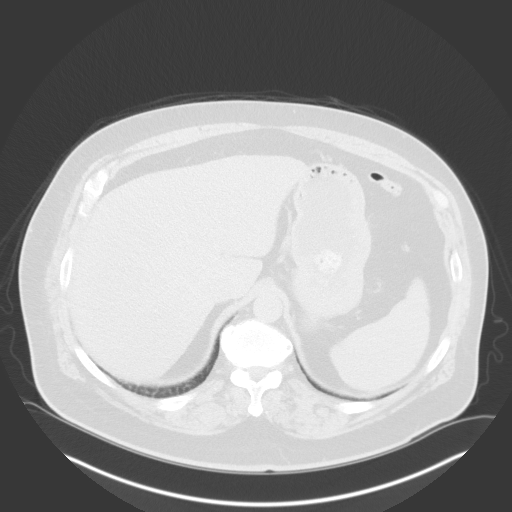
[im 90/103  soft-tissue]
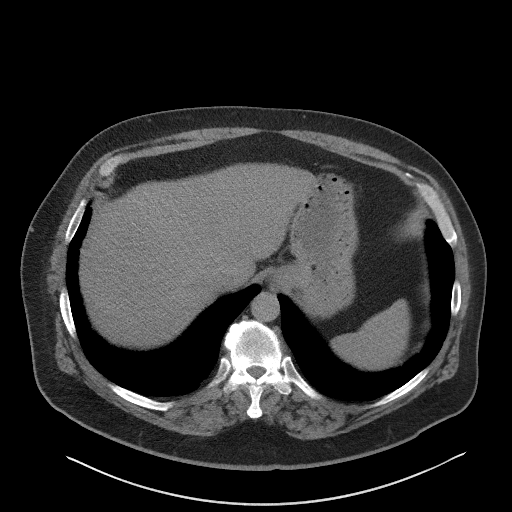
[im 90/103  lung]
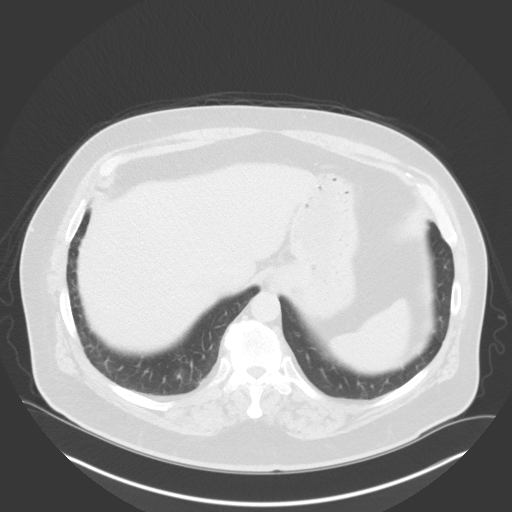
[im 94/103  lung]
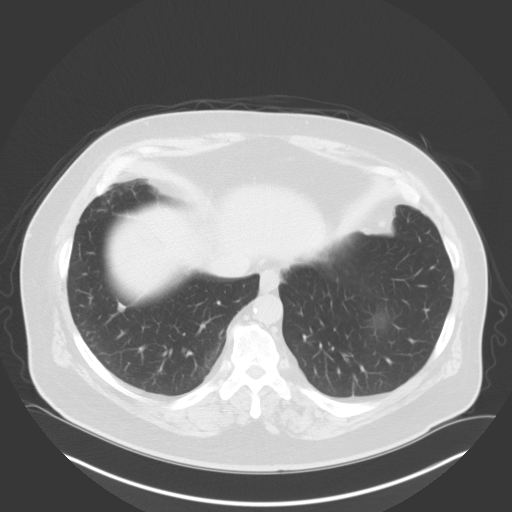
[im 98/103  soft-tissue]
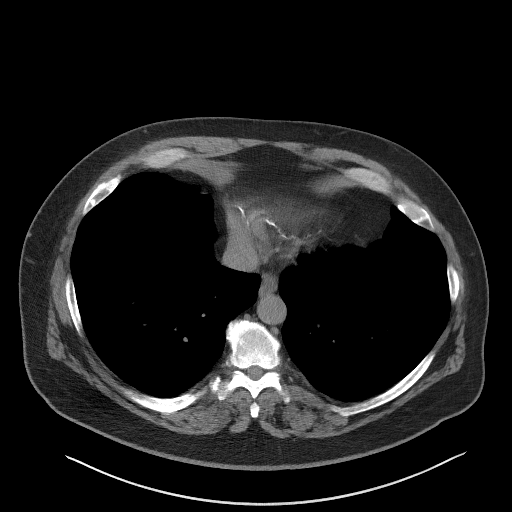
[im 98/103  lung]
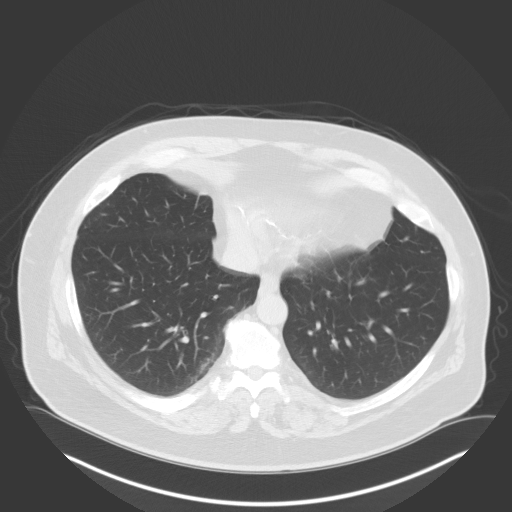

[15 of 32 positions shown; findings below may reference images not displayed]

FINDINGS: Lower chest: Smudgy nodule adjacent to the right hemidiaphragm
measures 5 mm ([DATE]) and is indicative of a subpleural lymph node.
Lung bases are otherwise clear. Heart size normal. Coronary artery
calcification. No pericardial or pleural effusion. Distal esophagus
is grossly unremarkable.

Hepatobiliary: Liver may be slightly heterogeneous in attenuation.
Cholecystectomy. No biliary ductal dilatation.

Pancreas: Negative.

Spleen: Negative.

Adrenals/Urinary Tract: Adrenal glands are unremarkable. Fluid
density mass in the left adrenal gland measures 3.8 cm, increased
from 1.8 cm on 12/10/2005. Kidneys are unremarkable. No urinary
stones. Ureters are decompressed. Bladder is grossly unremarkable.

Stomach/Bowel: Stomach, small bowel, appendix and colon are
unremarkable.

Vascular/Lymphatic: Atherosclerotic calcification of the aorta
without aneurysm. No pathologically enlarged lymph nodes.

Reproductive: Prostate is normal in size.

Other: No free fluid.  Mesenteries and peritoneum are unremarkable.

Musculoskeletal: Degenerative changes in the spine. No worrisome
lytic or sclerotic lesions.
IMPRESSION: 1. No urinary stones or obstruction. No findings to explain the
patient's symptoms.
2. Liver appears slightly steatotic.
3. Left adrenal adenoma, increased in size from [DATE].
4. Aortic atherosclerosis ([id number]). Coronary artery
calcification.

## 2022-04-18 ENCOUNTER — Other Ambulatory Visit: Payer: Self-pay | Admitting: Internal Medicine

## 2022-04-18 NOTE — Telephone Encounter (Signed)
Patient is requesting a refill of the following medications: Requested Prescriptions   Pending Prescriptions Disp Refills   clopidogrel (PLAVIX) 75 MG tablet [Pharmacy Med Name: Clopidogrel Bisulfate 75 MG Oral Tablet] 90 tablet 0    Sig: Take 1 tablet by mouth once daily   ALPRAZolam (XANAX) 0.5 MG tablet [Pharmacy Med Name: ALPRAZolam 0.5 MG Oral Tablet] 60 tablet 0    Sig: Take 1 tablet by mouth twice daily    Date of patient request: 04/18/22 Last office visit: 02/24/22 Date of last refill: 10/21/21 Last refill amount: 60

## 2022-04-21 ENCOUNTER — Other Ambulatory Visit: Payer: Self-pay

## 2022-04-23 ENCOUNTER — Other Ambulatory Visit: Payer: Self-pay | Admitting: Internal Medicine

## 2022-05-13 MED ORDER — ALPRAZOLAM 0.5 MG PO TABS: 0.5000 mg | ORAL_TABLET | Freq: Two times a day (BID) | ORAL | 0 refills | Status: AC

## 2022-05-13 NOTE — Addendum Note (Signed)
Addended by: Angus Seller on: 05/13/2022 09:54 AM   Modules accepted: Orders

## 2022-05-14 ENCOUNTER — Ambulatory Visit (INDEPENDENT_AMBULATORY_CARE_PROVIDER_SITE_OTHER): Payer: No Typology Code available for payment source | Admitting: Internal Medicine

## 2022-05-14 ENCOUNTER — Encounter: Payer: Self-pay | Admitting: Internal Medicine

## 2022-05-14 VITALS — BP 128/60 | HR 82 | Temp 98.3°F | Ht 70.0 in | Wt 275.4 lb

## 2022-05-14 DIAGNOSIS — G4733 Obstructive sleep apnea (adult) (pediatric): Secondary | ICD-10-CM | POA: Diagnosis not present

## 2022-05-14 DIAGNOSIS — E662 Morbid (severe) obesity with alveolar hypoventilation: Secondary | ICD-10-CM | POA: Diagnosis not present

## 2022-05-14 DIAGNOSIS — R059 Cough, unspecified: Secondary | ICD-10-CM | POA: Diagnosis not present

## 2022-05-14 DIAGNOSIS — Z6834 Body mass index (BMI) 34.0-34.9, adult: Secondary | ICD-10-CM

## 2022-05-14 DIAGNOSIS — R509 Fever, unspecified: Secondary | ICD-10-CM

## 2022-05-14 LAB — POCT INFLUENZA A/B
Influenza A, POC: NEGATIVE
Influenza B, POC: NEGATIVE

## 2022-05-14 LAB — POC COVID19 BINAXNOW: SARS Coronavirus 2 Ag: NEGATIVE

## 2022-05-14 MED ORDER — AZITHROMYCIN 250 MG PO TABS: ORAL_TABLET | ORAL | 1 refills | Status: AC

## 2022-05-14 NOTE — Assessment & Plan Note (Signed)
Stable at the present time. 

## 2022-05-14 NOTE — Progress Notes (Signed)
Established Patient Office Visit  Subjective:  Patient ID: GEZA BERANEK, male    DOB: 1956/05/07  Age: 67 y.o. MRN: [id number]  CC:  Chief Complaint  Patient presents with   Fever    Started yesterday,got up to 103, not feeling well, sinus headache and it wont go away, Chest felt congested-bronchitis like. Noticed today that when he coughed it made his chest hurt- getting phlegm clear up.    Hypertension    Been a little high, is taking BP med at night so he doesn't feel bad and its helped. It is fluctuating 180/90s then again it will be 140/90s.    Fever   Hypertension    Jarrid B Polinsky presents for uri  negfor covid and flue  Past Medical History:  Diagnosis Date   Anxiety and depression    a. severe - seeing psychiatry.   CAD (coronary artery disease)    a. s/p 4V CABG 2014  b. 05/2014 MV: No ischemia, EF 69%.   Chronic prostatitis    COPD (chronic obstructive pulmonary disease) (HCC)    ongoing tobacco use   GERD (gastroesophageal reflux disease)    History of DVT (deep vein thrombosis)    Hyperlipidemia    Hyperlipidemia    Hypertension    a. renal arteries patent on remote cath.   Obesity    OSA (obstructive sleep apnea)    severe   PAD (peripheral artery disease) ([place])    a. 01/2014 ABIs R: 0.92, L 1.05.  Know total occlusion of right SFA with distal reconstitution-->Med Rx.   Sleep apnea    Tobacco abuse    a. 05/2014 down to 3-5 cigarettes day and vaping also.    Past Surgical History:  Procedure Laterality Date   CHOLECYSTECTOMY     CORONARY ARTERY BYPASS GRAFT  October 2014   4V   CYSTOSCOPY     KNEE ARTHROSCOPY     right meniscal tear    Family History  Problem Relation Age of Onset   Hypertension Mother    Cataracts Mother    Diabetes Father    Adrenal disorder Neg Hx     Social History   Socioeconomic History   Marital status: Married    Spouse name: Not on file   Number of children: Not on file   Years of education: Not on  file   Highest education level: Not on file  Occupational History   Not on file  Tobacco Use   Smoking status: Former    Packs/day: 1.00    Years: 40.00    Total pack years: 40.00    Types: Cigarettes    Quit date: 08/11/2014    Years since quitting: 7.7   Smokeless tobacco: Never  Vaping Use   Vaping Use: Never used  Substance and Sexual Activity   Alcohol use: No    Alcohol/week: 0.0 standard drinks of alcohol   Drug use: No   Sexual activity: Yes    Birth control/protection: None  Other Topics Concern   Not on file  Social History Narrative   Not on file   Social Determinants of Health   Financial Resource Strain: Not on file  Food Insecurity: Not on file  Transportation Needs: Not on file  Physical Activity: Not on file  Stress: Not on file  Social Connections: Not on file  Intimate Partner Violence: Not on file     Current Outpatient Medications:    ALPRAZolam (XANAX) 0.5 MG tablet, Take 1  tablet (0.5 mg total) by mouth 2 (two) times daily., Disp: 60 tablet, Rfl: 0   Ascorbic Acid (VITAMIN C PO), Take 2 tablets by mouth daily., Disp: , Rfl:    aspirin EC 81 MG tablet, Take 81 mg by mouth daily., Disp: , Rfl:    atorvastatin (LIPITOR) 80 MG tablet, Take 1 tablet (80 mg total) by mouth daily., Disp: 90 tablet, Rfl: 3   azithromycin (ZITHROMAX) 250 MG tablet, Take 2 tablets on day 1, then 1 tablet daily on days 2 through 5, Disp: 6 tablet, Rfl: 1   B Complex Vitamins (B COMPLEX PO), Take by mouth., Disp: , Rfl:    clopidogrel (PLAVIX) 75 MG tablet, Take 1 tablet by mouth once daily, Disp: 90 tablet, Rfl: 0   co-enzyme Q-10 30 MG capsule, Take 30 mg by mouth 3 (three) times daily., Disp: , Rfl:    gabapentin (NEURONTIN) 100 MG capsule, Take 1 capsule (100 mg total) by mouth 3 (three) times daily., Disp: 90 capsule, Rfl: 3   losartan (COZAAR) 100 MG tablet, Take 1 tablet (100 mg total) by mouth daily., Disp: 90 tablet, Rfl: 3   MAGNESIUM PO, Take by mouth., Disp: , Rfl:     Menaquinone-7 (VITAMIN K2 PO), Take by mouth., Disp: , Rfl:    Multiple Vitamins-Minerals (ZINC PO), Take by mouth., Disp: , Rfl:    nitroGLYCERIN (NITROSTAT) 0.4 MG SL tablet, Place 1 tablet (0.4 mg total) under the tongue every 5 (five) minutes as needed for chest pain., Disp: 50 tablet, Rfl: 3   pantoprazole (PROTONIX) 40 MG tablet, Take 1 tablet (40 mg total) by mouth daily., Disp: 90 tablet, Rfl: 3   sertraline (ZOLOFT) 100 MG tablet, TAKE 1 & 1/2 (ONE & ONE-HALF) TABLETS BY MOUTH ONCE DAILY, Disp: 180 tablet, Rfl: 3   VITAMIN D PO, Take by mouth., Disp: , Rfl:    Allergies  Allergen Reactions   Relafen [Nabumetone] Other (See Comments)    Pt doesn't remember    Sulfa Antibiotics Diarrhea    ROS Review of Systems  Constitutional:  Positive for fever.  HENT: Negative.    Eyes: Negative.   Respiratory: Negative.    Cardiovascular: Negative.   Gastrointestinal: Negative.   Endocrine: Negative.   Genitourinary: Negative.   Musculoskeletal: Negative.   Skin: Negative.   Allergic/Immunologic: Negative.   Neurological: Negative.   Hematological: Negative.   Psychiatric/Behavioral: Negative.    All other systems reviewed and are negative.     Objective:    Physical Exam Vitals reviewed.  Constitutional:      Appearance: Normal appearance.  HENT:     Mouth/Throat:     Mouth: Mucous membranes are moist.  Eyes:     Pupils: Pupils are equal, round, and reactive to light.  Neck:     Vascular: No carotid bruit.  Cardiovascular:     Rate and Rhythm: Normal rate and regular rhythm.     Pulses: Normal pulses.     Heart sounds: Normal heart sounds.  Pulmonary:     Effort: Pulmonary effort is normal.     Breath sounds: Normal breath sounds.  Abdominal:     General: Bowel sounds are normal.     Palpations: Abdomen is soft. There is no hepatomegaly, splenomegaly or mass.     Tenderness: There is no abdominal tenderness.     Hernia: No hernia is present.   Musculoskeletal:     Cervical back: Neck supple.     Right lower leg: No  edema.     Left lower leg: No edema.  Skin:    Findings: No rash.  Neurological:     Mental Status: He is alert and oriented to person, place, and time.     Motor: No weakness.  Psychiatric:        Mood and Affect: Mood normal.        Behavior: Behavior normal.     BP 128/60   Pulse 82   Temp 98.3 F (36.8 C) (Temporal)   Ht _0  (1.778 m)   Wt 275 lb 6.4 oz (124.9 kg)   SpO2 92%   BMI 39.52 kg/m  Wt Readings from Last 3 Encounters:  05/14/22 275 lb 6.4 oz (124.9 kg)  02/24/22 273 lb (123.8 kg)  08/20/21 281 lb (127.5 kg)     Health Maintenance Due  Topic Date Due   Lung Cancer Screening  Never done   DTaP/Tdap/Td (2 - Tdap) 12/19/2014   INFLUENZA VACCINE  12/10/2021    There are no preventive care reminders to display for this patient.  Lab Results  Component Value Date   TSH 2.32 12/11/2020   Lab Results  Component Value Date   WBC 7.4 12/11/2020   HGB 15.7 12/11/2020   HCT 47.3 12/11/2020   MCV 89.4 12/11/2020   PLT 242 12/11/2020   Lab Results  Component Value Date   NA 142 12/11/2020   K 5.3 12/11/2020   CO2 26 12/11/2020   GLUCOSE 130 (H) 12/11/2020   BUN 14 12/11/2020   CREATININE 1.03 12/11/2020   BILITOT 0.4 12/11/2020   ALKPHOS 69 10/26/2015   AST 28 12/11/2020   ALT 34 12/11/2020   PROT 7.2 12/11/2020   ALBUMIN 4.3 10/26/2015   CALCIUM 9.6 12/11/2020   ANIONGAP 14 05/19/2014   EGFR 82 12/11/2020   Lab Results  Component Value Date   CHOL 224 (H) 12/11/2020   Lab Results  Component Value Date   HDL 44 12/11/2020   Lab Results  Component Value Date   LDLCALC 148 (H) 12/11/2020   Lab Results  Component Value Date   TRIG 187 (H) 12/11/2020   Lab Results  Component Value Date   CHOLHDL 5.1 (H) 12/11/2020   Lab Results  Component Value Date   HGBA1C 6.5 (H) 12/11/2020      Assessment & Plan:   Problem List Items Addressed This Visit        Respiratory   OSA (obstructive sleep apnea)    Stable at the present time.        Other   Class 1 obesity with alveolar hypoventilation, serious comorbidity, and body mass index (BMI) of 34.0 to 34.9 in adult [facility])    - I encouraged the patient to lose weight.  - I educated them on making healthy dietary choices including eating more fruits and vegetables and less fried foods. - I encouraged the patient to exercise more, and educated on the benefits of exercise including weight loss, diabetes prevention, and hypertension prevention.   Dietary counseling with a registered dietician  Referral to a weight management support group (e.g. Weight Watchers, Overeaters Anonymous)  If your BMI is greater than 29 or you have gained more than 15 pounds you should work on weight loss.  Attend a healthy cooking class      Cough    Take over-the-counter cough syrup      Relevant Orders   POC COVID-19 BinaxNow (Completed)   POCT Influenza A/B (Completed)  Fever - Primary    Patient is Negative for COVID and flu, started on Z-Pak if get worse go to the hospital      Relevant Orders   POC COVID-19 BinaxNow (Completed)   POCT Influenza A/B (Completed)    Meds ordered this encounter  Medications   azithromycin (ZITHROMAX) 250 MG tablet    Sig: Take 2 tablets on day 1, then 1 tablet daily on days 2 through 5    Dispense:  6 tablet    Refill:  1    Follow-up: No follow-ups on file.    Cletis Athens, MD

## 2022-05-14 NOTE — Assessment & Plan Note (Signed)

## 2022-05-14 NOTE — Assessment & Plan Note (Signed)
Take over-the-counter cough syrup

## 2022-05-14 NOTE — Assessment & Plan Note (Signed)
Patient is Negative for COVID and flu, started on Z-Pak if get worse go to the hospital

## 2022-07-25 ENCOUNTER — Other Ambulatory Visit: Payer: Self-pay

## 2022-07-25 MED ORDER — CLOPIDOGREL BISULFATE 75 MG PO TABS: 75.0000 mg | ORAL_TABLET | Freq: Every day | ORAL | 1 refills | Status: AC

## 2022-07-25 MED ORDER — ALPRAZOLAM 0.5 MG PO TABS: 0.5000 mg | ORAL_TABLET | Freq: Two times a day (BID) | ORAL | 1 refills | Status: AC

## 2022-07-25 MED ORDER — LOSARTAN POTASSIUM 100 MG PO TABS: 100.0000 mg | ORAL_TABLET | Freq: Every day | ORAL | 1 refills | Status: AC

## 2022-07-25 MED ORDER — PANTOPRAZOLE SODIUM 40 MG PO TBEC: 40.0000 mg | DELAYED_RELEASE_TABLET | Freq: Every day | ORAL | 1 refills | Status: AC

## 2022-07-25 MED ORDER — SERTRALINE HCL 100 MG PO TABS: 150.0000 mg | ORAL_TABLET | Freq: Every day | ORAL | 1 refills | Status: AC

## 2022-07-25 MED ORDER — VERAPAMIL HCL ER 240 MG PO TBCR: 240.0000 mg | EXTENDED_RELEASE_TABLET | Freq: Every day | ORAL | 1 refills | Status: AC

## 2022-07-25 MED ORDER — METFORMIN HCL 1000 MG PO TABS: 1000.0000 mg | ORAL_TABLET | Freq: Two times a day (BID) | ORAL | 1 refills | Status: AC

## 2022-12-17 ENCOUNTER — Telehealth: Payer: Self-pay

## 2022-12-17 NOTE — Telephone Encounter (Signed)
*  Primary  Pharmacy Patient Advocate Encounter   Received notification from CoverMyMeds that prior authorization for metFORMIN HCl ER (OSM) 1000MG  er tablets  is required/requested.   Insurance verification completed.   The patient is insured through [facility] .   Per test claim: PA required; PA submitted to [facility] via CoverMyMeds Key/confirmation #/EOC [id number] Status is pending

## 2022-12-18 NOTE — Telephone Encounter (Signed)
Richard Burton no longer part of [employer] system.  [facility] reviewed your METFORMIN ER 1,000 MG OSM-TAB request for the aboveidentified member, and it is denied for the following reason: because we did not see certain  details about your use and treatment. We see that this request is for a drug called metformin  ER (extended release) 1,000 milliligram OSM (osmotic) tablet for your use (morbid (severe)  obesity with alveolar hypoventilation). We may consider approval of this drug after a trial of a  certain other drug first (a trial and failure of metformin ER (extended release) tablet (generic for Glucophage ER)). We did not see records that you tried and did not respond well to this drug  first or that you cannot use it for certain reasons (such as a drug-drug interaction or adverse  drug experience). We based this decision on your health plan's prior authorization criteria  named Preferred Drug List
